# Patient Record
Sex: Female | Born: 1985 | Race: White | Hispanic: No | Marital: Married | Smoking: Former smoker
Health system: Southern US, Community
[De-identification: ages and names within clinical notes are randomized; demographics above are authoritative.]

## PROBLEM LIST (undated history)

## (undated) DIAGNOSIS — I456 Pre-excitation syndrome: Secondary | ICD-10-CM

## (undated) DIAGNOSIS — F419 Anxiety disorder, unspecified: Secondary | ICD-10-CM

## (undated) DIAGNOSIS — F429 Obsessive-compulsive disorder, unspecified: Secondary | ICD-10-CM

## (undated) HISTORY — DX: Obsessive-compulsive disorder, unspecified: F42.9

## (undated) HISTORY — PX: ATRIAL FIBRILLATION ABLATION: EP1191

## (undated) HISTORY — DX: Anxiety disorder, unspecified: F41.9

## (undated) HISTORY — PX: WISDOM TOOTH EXTRACTION: SHX21

## (undated) HISTORY — PX: OTHER SURGICAL HISTORY: SHX169

---

## 2010-12-24 ENCOUNTER — Emergency Department (INDEPENDENT_AMBULATORY_CARE_PROVIDER_SITE_OTHER): Payer: BC Managed Care – PPO

## 2010-12-24 ENCOUNTER — Emergency Department (HOSPITAL_BASED_OUTPATIENT_CLINIC_OR_DEPARTMENT_OTHER)
Admission: EM | Admit: 2010-12-24 | Discharge: 2010-12-24 | Disposition: A | Discharge status: Home / Self Care | Payer: BC Managed Care – PPO | Attending: Emergency Medicine | Admitting: Emergency Medicine

## 2010-12-24 DIAGNOSIS — R0789 Other chest pain: Secondary | ICD-10-CM

## 2010-12-24 DIAGNOSIS — R079 Chest pain, unspecified: Secondary | ICD-10-CM | POA: Insufficient documentation

## 2010-12-24 LAB — CBC
MCH: 30.8 pg (ref 26.0–34.0)
Platelets: 321 10*3/uL (ref 150–400)
RBC: 4.61 MIL/uL (ref 3.87–5.11)

## 2010-12-24 LAB — CK TOTAL AND CKMB (NOT AT ARMC): Total CK: 124 U/L (ref 7–177)

## 2010-12-24 LAB — TROPONIN I: Troponin I: <0.3 ng/mL (ref <0.30)

## 2010-12-24 LAB — BASIC METABOLIC PANEL
BUN: 8 mg/dL (ref 6–23)
CO2: 23 mEq/L (ref 19–32)
Chloride: 102 mEq/L (ref 96–112)
Creatinine, Ser: 0.8 mg/dL (ref 0.4–1.2)
Glucose, Bld: 102 mg/dL — ABNORMAL HIGH (ref 70–99)

## 2010-12-24 LAB — DIFFERENTIAL
Basophils Relative: 0 % (ref 0–1)
Eosinophils Absolute: 0.1 10*3/uL (ref 0.0–0.7)
Monocytes Relative: 11 % (ref 3–12)
Neutrophils Relative %: 73 % (ref 43–77)

## 2013-12-22 DIAGNOSIS — F429 Obsessive-compulsive disorder, unspecified: Secondary | ICD-10-CM

## 2013-12-22 DIAGNOSIS — I491 Atrial premature depolarization: Secondary | ICD-10-CM | POA: Insufficient documentation

## 2013-12-22 DIAGNOSIS — I456 Pre-excitation syndrome: Secondary | ICD-10-CM | POA: Insufficient documentation

## 2013-12-22 DIAGNOSIS — M419 Scoliosis, unspecified: Secondary | ICD-10-CM | POA: Insufficient documentation

## 2013-12-22 DIAGNOSIS — R002 Palpitations: Secondary | ICD-10-CM | POA: Insufficient documentation

## 2017-09-10 ENCOUNTER — Encounter (HOSPITAL_COMMUNITY): Payer: Self-pay | Admitting: *Deleted

## 2017-09-10 ENCOUNTER — Other Ambulatory Visit: Payer: Self-pay

## 2017-09-10 ENCOUNTER — Emergency Department (HOSPITAL_COMMUNITY)
Admission: EM | Admit: 2017-09-10 | Discharge: 2017-09-10 | Disposition: A | Discharge status: Home / Self Care | Payer: BLUE CROSS/BLUE SHIELD | Attending: Emergency Medicine | Admitting: Emergency Medicine

## 2017-09-10 DIAGNOSIS — R1030 Lower abdominal pain, unspecified: Secondary | ICD-10-CM | POA: Diagnosis present

## 2017-09-10 HISTORY — DX: Pre-excitation syndrome: I45.6

## 2017-09-10 LAB — CBC WITH DIFFERENTIAL/PLATELET
BASOS PCT: 0 %
Basophils Absolute: 0 10*3/uL (ref 0.0–0.1)
EOS PCT: 0 %
Eosinophils Absolute: 0 10*3/uL (ref 0.0–0.7)
HEMATOCRIT: 32.3 % — AB (ref 36.0–46.0)
HEMOGLOBIN: 10.7 g/dL — AB (ref 12.0–15.0)
Lymphocytes Relative: 19 %
Lymphs Abs: 2 10*3/uL (ref 0.7–4.0)
MCH: 27.6 pg (ref 26.0–34.0)
MCHC: 33.1 g/dL (ref 30.0–36.0)
MCV: 83.5 fL (ref 78.0–100.0)
MONO ABS: 1.2 10*3/uL (ref 0.1–1.0)
MONOS PCT: 12 %
NEUTROS ABS: 7 10*3/uL (ref 1.7–7.7)
Neutrophils Relative %: 69 %
Platelets: 304 10*3/uL (ref 150–400)
RBC: 3.87 MIL/uL (ref 3.87–5.11)
RDW: 13.1 % (ref 11.5–15.5)
WBC: 10.2 10*3/uL (ref 4.0–10.5)

## 2017-09-10 LAB — URINALYSIS, ROUTINE W REFLEX MICROSCOPIC
Bilirubin Urine: NEGATIVE
Glucose, UA: NEGATIVE mg/dL
Ketones, ur: NEGATIVE mg/dL
Leukocytes, UA: NEGATIVE
Nitrite: NEGATIVE
PROTEIN: NEGATIVE mg/dL
RBC / HPF: NONE SEEN RBC/hpf (ref 0–5)
Specific Gravity, Urine: 1.004 — ABNORMAL LOW (ref 1.005–1.030)
pH: 6 (ref 5.0–8.0)

## 2017-09-10 LAB — BASIC METABOLIC PANEL
Anion gap: 9 (ref 5–15)
BUN: 9 mg/dL (ref 6–20)
CHLORIDE: 108 mmol/L (ref 101–111)
CO2: 19 mmol/L — AB (ref 22–32)
Calcium: 8.8 mg/dL — ABNORMAL LOW (ref 8.9–10.3)
Creatinine, Ser: 0.7 mg/dL (ref 0.44–1.00)
GFR calc Af Amer: >60 mL/min (ref >60)
GFR calc non Af Amer: >60 mL/min (ref >60)
GLUCOSE: 96 mg/dL (ref 65–99)
POTASSIUM: 3.5 mmol/L (ref 3.5–5.1)
Sodium: 136 mmol/L (ref 135–145)

## 2017-09-10 MED ORDER — SODIUM CHLORIDE 0.9 % IV BOLUS (SEPSIS)
500.0000 mL | Freq: Once | INTRAVENOUS | Status: AC
  Administered 2017-09-10: 500 mL via INTRAVENOUS

## 2017-09-10 NOTE — ED Provider Notes (Signed)
[facility] EMERGENCY DEPARTMENT Provider Note   CSN: [id number] Arrival date & time: 09/10/17  1752     History   Chief Complaint Chief Complaint  Patient presents with  . Abdominal Pain    HPI Madeline Nelson is a 32 y.o. female.  Patient states that she is [redacted] weeks pregnant and felt like she had mucus discharge from her vagina and then a pressure on her lower abdomen.   The history is provided by the patient. No language interpreter was used.  Abdominal Pain   This is a new problem. The problem occurs rarely. The problem has not changed since onset.The pain is associated with an unknown factor. Pertinent negatives include diarrhea, frequency, hematuria and headaches.    Past Medical History:  Diagnosis Date  . Wolff-Parkinson-White (WPW) syndrome     Patient Active Problem List   Diagnosis Date Noted  . Palpitations 12/22/2013  . WPW (Wolff-Parkinson-White syndrome) 12/22/2013  . PAC (premature atrial contraction) 12/22/2013  . OCD (obsessive compulsive disorder) 12/22/2013  . Scoliosis 12/22/2013    Past Surgical History:  Procedure Laterality Date  . ATRIAL FIBRILLATION ABLATION    . CESAREAN SECTION    . WISDOM TOOTH EXTRACTION      OB History    Gravida Para Term Preterm AB Living   1             SAB TAB Ectopic Multiple Live Births                   Home Medications    Prior to Admission medications   Not on File    Family History History reviewed. No pertinent family history.  Social History Social History   Tobacco Use  . Smoking status: Never Smoker  . Smokeless tobacco: Never Used  Substance Use Topics  . Alcohol use: No    Frequency: Never  . Drug use: No     Allergies   Sudafed [pseudoephedrine hcl]   Review of Systems Review of Systems  Constitutional: Negative for appetite change and fatigue.  HENT: Negative for congestion, ear discharge and sinus pressure.   Eyes: Negative for discharge.  Respiratory: Negative for  cough.   Cardiovascular: Negative for chest pain.  Gastrointestinal: Positive for abdominal pain. Negative for diarrhea.  Genitourinary: Negative for frequency and hematuria.  Musculoskeletal: Negative for back pain.  Skin: Negative for rash.  Neurological: Negative for seizures and headaches.  Psychiatric/Behavioral: Negative for hallucinations.     Physical Exam Updated Vital Signs BP (!) 143/83 (BP Location: Right Arm)   Pulse (!) 115   Temp 98.3 F (36.8 C) (Oral)   Resp 18   Ht 5\' 4"  (1.626 m)   Wt 67.6 kg (149 lb)   SpO2 100%   BMI 25.58 kg/m   Physical Exam  Constitutional: She is oriented to person, place, and time. She appears well-developed.  HENT:  Head: Normocephalic.  Eyes: Conjunctivae and EOM are normal. No scleral icterus.  Neck: Neck supple. No thyromegaly present.  Cardiovascular: Normal rate and regular rhythm. Exam reveals no gallop and no friction rub.  No murmur heard. Pulmonary/Chest: No stridor. She has no wheezes. She has no rales. She exhibits no tenderness.  Abdominal: She exhibits no distension. There is no tenderness. There is no rebound.  Abdomen nontender but consistent with 3 4-week pregnancy  Genitourinary:  Genitourinary Comments: Bimanual exam done and patient is 0% effaced not dilated at all vertex presentation.  Nitrazine test was done and was consistent  with not ruptured membranes  Musculoskeletal: Normal range of motion. She exhibits no edema.  Lymphadenopathy:    She has no cervical adenopathy.  Neurological: She is oriented to person, place, and time. She exhibits normal muscle tone. Coordination normal.  Skin: No rash noted. No erythema.  Psychiatric: She has a normal mood and affect. Her behavior is normal.     ED Treatments / Results  Labs (all labs ordered are listed, but only abnormal results are displayed) Labs Reviewed  BASIC METABOLIC PANEL - Abnormal; Notable for the following components:      Result Value   CO2 19  (*)    Calcium 8.8 (*)    All other components within normal limits  URINALYSIS, ROUTINE W REFLEX MICROSCOPIC - Abnormal; Notable for the following components:   Color, Urine STRAW (*)    Specific Gravity, Urine 1.004 (*)    Hgb urine dipstick SMALL (*)    Bacteria, UA RARE (*)    Squamous Epithelial / LPF 0-5 (*)    All other components within normal limits  CBC WITH DIFFERENTIAL/PLATELET    EKG  EKG Interpretation None       Radiology No results found.  Procedures Procedures (including critical care time)  Medications Ordered in ED Medications  sodium chloride 0.9 % bolus 500 mL (500 mLs Intravenous New Bag/Given 09/10/17 1851)     Initial Impression / Assessment and Plan / ED Course  I have reviewed the triage vital signs and the nursing notes.  Pertinent labs & imaging results that were available during my care of the patient were reviewed by me and considered in my medical decision making (see chart for details).   Patient was monitored for over an hour and it was decided by the OB/GYN team at [facility] that the patient could be discharged home to be followed up with her OB doctor.  Diagnosis intrauterine pregnancy 34 weeks no preterm labor at this time or ruptured membranes  Final Clinical Impressions(s) / ED Diagnoses   Final diagnoses:  Lower abdominal pain    ED Discharge Orders    None       Bethann BerkshireZammit, Lexandra Rettke, MD 09/10/17 1956

## 2017-09-10 NOTE — Progress Notes (Signed)
Received call from [facility] RN, Verlon AuLeslie. Pt is a G3P2 at 4334 1/[redacted] weeks gestation presenting with c/o a thick white d/c and abd pressure. Pt has had two previous C/S. Says she gets [facility]. Says pt is not having any vaginal bleeding. Says Dr. Estell HarpinZammit. Has checked the pt's cervix and she is not dilated. He does not think her membranes are ruptured.

## 2017-09-10 NOTE — ED Notes (Signed)
[facility], may take off the monitor.

## 2017-09-10 NOTE — Progress Notes (Signed)
Reactive NST, FHT 145bpm, multilple accelerations, no decelerations, Cat 1 tracing. Notified Dr. Despina HiddenEure, OB cleared, advised Christell ConstantMoore RN ok to d/c monitoring.

## 2017-09-10 NOTE — ED Triage Notes (Signed)
Pt states she is [redacted] weeks pregnant and states about an hour ago she had a thick white discharge come out of her vagina; pt states she feels pressure to her pelvic area and is having some abdominal pain and nausea

## 2017-09-10 NOTE — Progress Notes (Signed)
Spoke with Dr. Despina HiddenEure. Pt is a G3P2 at 5434 1/[redacted] weeks gestation presenting to [facility] with c/o white vaginal d/c and abd pressure. Dr. Estell HarpinZammit says the pt's cervix is not dilated and she is not having vaginal bleeding. [facility]. Pt has had two previous C/S. FHR tracing is a category 1 with no uc's. Labs  and U/A have been ordered. We will cont to watch pt for any uc's, and if pt is not contracting, she can be OB cleared.. [facility].

## 2017-09-10 NOTE — ED Notes (Signed)
OB rapid response notified this nurse via telephone they will monitor patient for 30 more minutes, patient to follow up with womens/primary care dr.

## 2017-09-10 NOTE — Discharge Instructions (Signed)
Call your OB/GYN doctor tomorrow and let them know what happened.

## 2017-09-10 NOTE — ED Notes (Signed)
Litmus paper - 5

## 2017-09-10 NOTE — Progress Notes (Signed)
Report given to Kathy Larrabee RN 

## 2021-01-04 ENCOUNTER — Encounter: Payer: BC Managed Care – PPO | Admitting: Family Medicine

## 2021-01-08 ENCOUNTER — Other Ambulatory Visit: Payer: Self-pay

## 2021-01-08 ENCOUNTER — Ambulatory Visit (INDEPENDENT_AMBULATORY_CARE_PROVIDER_SITE_OTHER): Payer: BC Managed Care – PPO | Admitting: Women's Health

## 2021-01-08 ENCOUNTER — Encounter: Payer: Self-pay | Admitting: Women's Health

## 2021-01-08 VITALS — BP 131/88 | HR 97 | Ht 64.0 in | Wt 136.5 lb

## 2021-01-08 DIAGNOSIS — Z3201 Encounter for pregnancy test, result positive: Secondary | ICD-10-CM | POA: Diagnosis not present

## 2021-01-08 DIAGNOSIS — Z3491 Encounter for supervision of normal pregnancy, unspecified, first trimester: Secondary | ICD-10-CM | POA: Diagnosis not present

## 2021-01-08 DIAGNOSIS — Z3A01 Less than 8 weeks gestation of pregnancy: Secondary | ICD-10-CM | POA: Diagnosis not present

## 2021-01-08 DIAGNOSIS — Z98891 History of uterine scar from previous surgery: Secondary | ICD-10-CM | POA: Diagnosis not present

## 2021-01-08 DIAGNOSIS — F419 Anxiety disorder, unspecified: Secondary | ICD-10-CM

## 2021-01-08 LAB — POCT URINE PREGNANCY: Preg Test, Ur: POSITIVE — AB

## 2021-01-08 NOTE — Patient Instructions (Signed)
Baxter Hire, thank you for choosing our office today! We appreciate the opportunity to meet your healthcare needs. You may receive a short survey by mail, e-mail, or through [employer]. If you are happy with your care we would appreciate if you could take just a few minutes to complete the survey questions. We read all of your comments and take your feedback very seriously. Thank you again for choosing our office.  Center for [employer] Healthcare Team at [facility]  [facility] ([address] [place], [place] [address]) Entrance C, located off of [address] Free 24/7 valet parking   Nausea & Vomiting Have saltine crackers or pretzels by your bed and eat a few bites before you raise your head out of bed in the morning Eat small frequent meals throughout the day instead of large meals Drink plenty of fluids throughout the day to stay hydrated, just don't drink a lot of fluids with your meals.  This can make your stomach fill up faster making you feel sick Do not brush your teeth right after you eat Products with real ginger are good for nausea, like ginger ale and ginger hard candy Make sure it says made with real ginger! Sucking on sour candy like lemon heads is also good for nausea If your prenatal vitamins make you nauseated, take them at night so you will sleep through the nausea Sea Bands If you feel like you need medicine for the nausea & vomiting please let us know If you are unable to keep any fluids or food down please let us know   Constipation Drink plenty of fluid, preferably water, throughout the day Eat foods high in fiber such as fruits, vegetables, and grains Exercise, such as walking, is a good way to keep your bowels regular Drink warm fluids, especially warm prune juice, or decaf coffee Eat a 1/2 cup of real oatmeal (not instant), 1/2 cup applesauce, and 1/2-1 cup warm prune juice every day If needed, you may take Colace (docusate sodium) stool softener  once or twice a day to help keep the stool soft.  If you still are having problems with constipation, you may take Miralax once daily as needed to help keep your bowels regular.   Home Blood Pressure Monitoring for Patients   Your provider has recommended that you check your blood pressure (BP) at least once a week at home. If you do not have a blood pressure cuff at home, one will be provided for you. Contact your provider if you have not received your monitor within 1 week.   Helpful Tips for Accurate Home Blood Pressure Checks  Don't smoke, exercise, or drink caffeine 30 minutes before checking your BP Use the restroom before checking your BP (a full bladder can raise your pressure) Relax in a comfortable upright chair Feet on the ground Left arm resting comfortably on a flat surface at the level of your heart Legs uncrossed Back supported Sit quietly and don't talk Place the cuff on your bare arm Adjust snuggly, so that only two fingertips can fit between your skin and the top of the cuff Check 2 readings separated by at least one minute Keep a log of your BP readings For a visual, please reference this diagram: http://ccnc.care/bpdiagram  Provider Name: [facility]     Phone: [phone]  Zone 1: ALL CLEAR  Continue to monitor your symptoms:  BP reading is less than 140 (top number) or less than 90 (bottom  number)  No right upper stomach pain No headaches or seeing spots No feeling nauseated or throwing up No swelling in face and hands  Zone 2: CAUTION Call your doctor's office for any of the following:  BP reading is greater than 140 (top number) or greater than 90 (bottom number)  Stomach pain under your ribs in the middle or right side Headaches or seeing spots Feeling nauseated or throwing up Swelling in face and hands  Zone 3: EMERGENCY  Seek immediate medical care if you have any of the following:  BP reading is greater than160 (top number) or greater than  110 (bottom number) Severe headaches not improving with Tylenol Serious difficulty catching your breath Any worsening symptoms from Zone 2    First Trimester of Pregnancy The first trimester of pregnancy is from week 1 until the end of week 12 (months 1 through 3). A week after a sperm fertilizes an egg, the egg will implant on the wall of the uterus. This embryo will begin to develop into a baby. Genes from you and your partner are forming the baby. The female genes determine whether the baby is a boy or a girl. At 6-8 weeks, the eyes and face are formed, and the heartbeat can be seen on ultrasound. At the end of 12 weeks, all the baby's organs are formed.  Now that you are pregnant, you will want to do everything you can to have a healthy baby. Two of the most important things are to get good prenatal care and to follow your health care provider's instructions. Prenatal care is all the medical care you receive before the baby's birth. This care will help prevent, find, and treat any problems during the pregnancy and childbirth. BODY CHANGES Your body goes through many changes during pregnancy. The changes vary from woman to woman.  You may gain or lose a couple of pounds at first. You may feel sick to your stomach (nauseous) and throw up (vomit). If the vomiting is uncontrollable, call your health care provider. You may tire easily. You may develop headaches that can be relieved by medicines approved by your health care provider. You may urinate more often. Painful urination may mean you have a bladder infection. You may develop heartburn as a result of your pregnancy. You may develop constipation because certain hormones are causing the muscles that push waste through your intestines to slow down. You may develop hemorrhoids or swollen, bulging veins (varicose veins). Your breasts may begin to grow larger and become tender. Your nipples may stick out more, and the tissue that surrounds them  (areola) may become darker. Your gums may bleed and may be sensitive to brushing and flossing. Dark spots or blotches (chloasma, mask of pregnancy) may develop on your face. This will likely fade after the baby is born. Your menstrual periods will stop. You may have a loss of appetite. You may develop cravings for certain kinds of food. You may have changes in your emotions from day to day, such as being excited to be pregnant or being concerned that something may go wrong with the pregnancy and baby. You may have more vivid and strange dreams. You may have changes in your hair. These can include thickening of your hair, rapid growth, and changes in texture. Some women also have hair loss during or after pregnancy, or hair that feels dry or thin. Your hair will most likely return to normal after your baby is born. WHAT TO EXPECT AT YOUR PRENATAL  VISITS During a routine prenatal visit: You will be weighed to make sure you and the baby are growing normally. Your blood pressure will be taken. Your abdomen will be measured to track your baby's growth. The fetal heartbeat will be listened to starting around week 10 or 12 of your pregnancy. Test results from any previous visits will be discussed. Your health care provider may ask you: How you are feeling. If you are feeling the baby move. If you have had any abnormal symptoms, such as leaking fluid, bleeding, severe headaches, or abdominal cramping. If you have any questions. Other tests that may be performed during your first trimester include: Blood tests to find your blood type and to check for the presence of any previous infections. They will also be used to check for low iron levels (anemia) and Rh antibodies. Later in the pregnancy, blood tests for diabetes will be done along with other tests if problems develop. Urine tests to check for infections, diabetes, or protein in the urine. An ultrasound to confirm the proper growth and development  of the baby. An amniocentesis to check for possible genetic problems. Fetal screens for spina bifida and Down syndrome. You may need other tests to make sure you and the baby are doing well. HOME CARE INSTRUCTIONS  Medicines Follow your health care provider's instructions regarding medicine use. Specific medicines may be either safe or unsafe to take during pregnancy. Take your prenatal vitamins as directed. If you develop constipation, try taking a stool softener if your health care provider approves. Diet Eat regular, well-balanced meals. Choose a variety of foods, such as meat or vegetable-based protein, fish, milk and low-fat dairy products, vegetables, fruits, and whole grain breads and cereals. Your health care provider will help you determine the amount of weight gain that is right for you. Avoid raw meat and uncooked cheese. These carry germs that can cause birth defects in the baby. Eating four or five small meals rather than three large meals a day may help relieve nausea and vomiting. If you start to feel nauseous, eating a few soda crackers can be helpful. Drinking liquids between meals instead of during meals also seems to help nausea and vomiting. If you develop constipation, eat more high-fiber foods, such as fresh vegetables or fruit and whole grains. Drink enough fluids to keep your urine clear or pale yellow. Activity and Exercise Exercise only as directed by your health care provider. Exercising will help you: Control your weight. Stay in shape. Be prepared for labor and delivery. Experiencing pain or cramping in the lower abdomen or low back is a good sign that you should stop exercising. Check with your health care provider before continuing normal exercises. Try to avoid standing for long periods of time. Move your legs often if you must stand in one place for a long time. Avoid heavy lifting. Wear low-heeled shoes, and practice good posture. You may continue to have sex  unless your health care provider directs you otherwise. Relief of Pain or Discomfort Wear a good support bra for breast tenderness.   Take warm sitz baths to soothe any pain or discomfort caused by hemorrhoids. Use hemorrhoid cream if your health care provider approves.   Rest with your legs elevated if you have leg cramps or low back pain. If you develop varicose veins in your legs, wear support hose. Elevate your feet for 15 minutes, 3-4 times a day. Limit salt in your diet. Prenatal Care Schedule your prenatal visits by the  twelfth week of pregnancy. They are usually scheduled monthly at first, then more often in the last 2 months before delivery. Write down your questions. Take them to your prenatal visits. Keep all your prenatal visits as directed by your health care provider. Safety Wear your seat belt at all times when driving. Make a list of emergency phone numbers, including numbers for family, friends, the hospital, and police and fire departments. General Tips Ask your health care provider for a referral to a local prenatal education class. Begin classes no later than at the beginning of month 6 of your pregnancy. Ask for help if you have counseling or nutritional needs during pregnancy. Your health care provider can offer advice or refer you to specialists for help with various needs. Do not use hot tubs, steam rooms, or saunas. Do not douche or use tampons or scented sanitary pads. Do not cross your legs for long periods of time. Avoid cat litter boxes and soil used by cats. These carry germs that can cause birth defects in the baby and possibly loss of the fetus by miscarriage or stillbirth. Avoid all smoking, herbs, alcohol, and medicines not prescribed by your health care provider. Chemicals in these affect the formation and growth of the baby. Schedule a dentist appointment. At home, brush your teeth with a soft toothbrush and be gentle when you floss. SEEK MEDICAL CARE IF:   You have dizziness. You have mild pelvic cramps, pelvic pressure, or nagging pain in the abdominal area. You have persistent nausea, vomiting, or diarrhea. You have a bad smelling vaginal discharge. You have pain with urination. You notice increased swelling in your face, hands, legs, or ankles. SEEK IMMEDIATE MEDICAL CARE IF:  You have a fever. You are leaking fluid from your vagina. You have spotting or bleeding from your vagina. You have severe abdominal cramping or pain. You have rapid weight gain or loss. You vomit blood or material that looks like coffee grounds. You are exposed to [place] measles and have never had them. You are exposed to fifth disease or chickenpox. You develop a severe headache. You have shortness of breath. You have any kind of trauma, such as from a fall or a car accident. Document Released: 07/02/2001 Document Revised: 11/22/2013 Document Reviewed: 05/18/2013 [facility] Patient Information 2015 [place], [place]. This information is not intended to replace advice given to you by your health care provider. Make sure you discuss any questions you have with your health care provider.

## 2021-01-08 NOTE — Progress Notes (Signed)
   GYN VISIT Patient name: Madeline Nelson MRN [id number]  Date of birth: 11/01/1985 Chief Complaint:   Possible Pregnancy (Has had 3+ UPT @ home)  History of Present Illness:   Madeline Nelson is a 35 y.o. G57P3003 Caucasian female being seen today for +HPT x 3. LMP uncertain. No n/v. Taking pnv. Prev c/s x 3 in [place], moved to [place] w/in last few years.  Does have anxiety w/ panic attacks, has rx for lexapro and vistaril. Not currently taking, feels like she's doing well w/o it, but still likes to have on hand in case needed.  Patient's last menstrual period was 11/19/2020. The current method of family planning is none.  Depression screen [facility] 2/9 01/08/2021  Decreased Interest 0  Down, Depressed, Hopeless 0  PHQ - 2 Score 0  Altered sleeping 0  Tired, decreased energy 1  Change in appetite 0  Feeling bad or failure about yourself  0  Trouble concentrating 0  Moving slowly or fidgety/restless 0  Suicidal thoughts 0  PHQ-9 Score 1     GAD 7 : Generalized Anxiety Score 01/08/2021  Nervous, Anxious, on Edge 1  Control/stop worrying 0  Worry too much - different things 0  Trouble relaxing 0  Restless 0  Easily annoyed or irritable 0  Afraid - awful might happen 0  Total GAD 7 Score 1     Review of Systems:   Pertinent items are noted in HPI Denies fever/chills, dizziness, headaches, visual disturbances, fatigue, shortness of breath, chest pain, abdominal pain, vomiting, abnormal vaginal discharge/itching/odor/irritation, problems with periods, bowel movements, urination, or intercourse unless otherwise stated above.  Pertinent History Reviewed:  Reviewed past medical,surgical, social, obstetrical and family history.  Reviewed problem list, medications and allergies. Physical Assessment:   Vitals:   01/08/21 1032  Weight: 136 lb 8 oz (61.9 kg)  Height: 5\' 4"  (1.626 m)  Body mass index is 23.43 kg/m.       Physical Examination:   General appearance: alert, well appearing,  and in no distress  Mental status: alert, oriented to person, place, and time  Skin: warm & dry   Cardiovascular: normal heart rate noted  Respiratory: normal respiratory effort, no distress  Abdomen: soft, non-tender   Pelvic: examination not indicated  Extremities: no edema   Chaperone: N/A    Results for orders placed or performed in visit on 01/08/21 (from the past 24 hour(s))  POCT urine pregnancy   Collection Time: 01/08/21 10:45 AM  Result Value Ref Range   Preg Test, Ur Positive (A) Negative    Assessment & Plan:  1) ~[redacted]wks pregnant by uncertain LMP> will get dating u/s, continue pnv. New OB packet given.  2) Anxiety w/ panic attacks> feels she's doing well right now, has rx for lexapro and vistaril- not currently taking, likes to have in case needed.   Meds: No orders of the defined types were placed in this encounter.   Orders Placed This Encounter  Procedures   01/10/21 OB Comp Less 14 Wks   POCT urine pregnancy    Return for 1-2wks, [place]: dating.  [place] CNM, [facility] 01/08/2021 11:01 AM

## 2021-01-18 ENCOUNTER — Other Ambulatory Visit: Payer: Self-pay

## 2021-01-18 ENCOUNTER — Emergency Department (HOSPITAL_COMMUNITY)
Admission: EM | Admit: 2021-01-18 | Discharge: 2021-01-18 | Disposition: A | Discharge status: Home / Self Care | Payer: BC Managed Care – PPO | Attending: Emergency Medicine | Admitting: Emergency Medicine

## 2021-01-18 ENCOUNTER — Emergency Department (HOSPITAL_COMMUNITY): Payer: BC Managed Care – PPO

## 2021-01-18 ENCOUNTER — Encounter (HOSPITAL_COMMUNITY): Payer: Self-pay | Admitting: Emergency Medicine

## 2021-01-18 DIAGNOSIS — N898 Other specified noninflammatory disorders of vagina: Secondary | ICD-10-CM | POA: Diagnosis not present

## 2021-01-18 DIAGNOSIS — B9689 Other specified bacterial agents as the cause of diseases classified elsewhere: Secondary | ICD-10-CM | POA: Diagnosis not present

## 2021-01-18 DIAGNOSIS — Z3A01 Less than 8 weeks gestation of pregnancy: Secondary | ICD-10-CM | POA: Diagnosis not present

## 2021-01-18 DIAGNOSIS — N760 Acute vaginitis: Secondary | ICD-10-CM

## 2021-01-18 DIAGNOSIS — O23591 Infection of other part of genital tract in pregnancy, first trimester: Secondary | ICD-10-CM | POA: Insufficient documentation

## 2021-01-18 DIAGNOSIS — Z87891 Personal history of nicotine dependence: Secondary | ICD-10-CM | POA: Diagnosis not present

## 2021-01-18 DIAGNOSIS — O208 Other hemorrhage in early pregnancy: Secondary | ICD-10-CM | POA: Diagnosis not present

## 2021-01-18 DIAGNOSIS — O4690 Antepartum hemorrhage, unspecified, unspecified trimester: Secondary | ICD-10-CM

## 2021-01-18 LAB — URINALYSIS, ROUTINE W REFLEX MICROSCOPIC
Bacteria, UA: NONE SEEN
Bilirubin Urine: NEGATIVE
Glucose, UA: NEGATIVE mg/dL
Ketones, ur: NEGATIVE mg/dL
Leukocytes,Ua: NEGATIVE
Nitrite: NEGATIVE
Protein, ur: NEGATIVE mg/dL
Specific Gravity, Urine: 1.002 — ABNORMAL LOW (ref 1.005–1.030)
pH: 7 (ref 5.0–8.0)

## 2021-01-18 LAB — CBC WITH DIFFERENTIAL/PLATELET
Abs Immature Granulocytes: 0.03 10*3/uL (ref 0.00–0.07)
Basophils Absolute: 0.1 10*3/uL (ref 0.0–0.1)
Basophils Relative: 1 %
Eosinophils Absolute: 0 10*3/uL (ref 0.0–0.5)
Eosinophils Relative: 1 %
HCT: 47.9 % — ABNORMAL HIGH (ref 36.0–46.0)
Hemoglobin: 15.9 g/dL — ABNORMAL HIGH (ref 12.0–15.0)
Immature Granulocytes: 1 %
Lymphocytes Relative: 31 %
Lymphs Abs: 1.9 10*3/uL (ref 0.7–4.0)
MCH: 32.6 pg (ref 26.0–34.0)
MCHC: 33.2 g/dL (ref 30.0–36.0)
MCV: 98.2 fL (ref 80.0–100.0)
Monocytes Absolute: 0.6 10*3/uL (ref 0.1–1.0)
Monocytes Relative: 10 %
Neutro Abs: 3.5 10*3/uL (ref 1.7–7.7)
Neutrophils Relative %: 56 %
Platelets: 339 10*3/uL (ref 150–400)
RBC: 4.88 MIL/uL (ref 3.87–5.11)
RDW: 12.1 % (ref 11.5–15.5)
WBC: 6.1 10*3/uL (ref 4.0–10.5)
nRBC: 0 % (ref 0.0–0.2)

## 2021-01-18 LAB — COMPREHENSIVE METABOLIC PANEL
ALT: 15 U/L (ref 0–44)
AST: 18 U/L (ref 15–41)
Albumin: 5 g/dL (ref 3.5–5.0)
Alkaline Phosphatase: 54 U/L (ref 38–126)
Anion gap: 8 (ref 5–15)
BUN: 9 mg/dL (ref 6–20)
CO2: 23 mmol/L (ref 22–32)
Calcium: 10 mg/dL (ref 8.9–10.3)
Chloride: 105 mmol/L (ref 98–111)
Creatinine, Ser: 0.63 mg/dL (ref 0.44–1.00)
GFR, Estimated: >60 mL/min (ref >60)
Glucose, Bld: 101 mg/dL — ABNORMAL HIGH (ref 70–99)
Potassium: 3.8 mmol/L (ref 3.5–5.1)
Sodium: 136 mmol/L (ref 135–145)
Total Bilirubin: 0.7 mg/dL (ref 0.3–1.2)
Total Protein: 8.1 g/dL (ref 6.5–8.1)

## 2021-01-18 LAB — WET PREP, GENITAL
Sperm: NONE SEEN
Trich, Wet Prep: NONE SEEN
Yeast Wet Prep HPF POC: NONE SEEN

## 2021-01-18 LAB — HCG, QUANTITATIVE, PREGNANCY: hCG, Beta Chain, Quant, S: 10025 m[IU]/mL — ABNORMAL HIGH (ref <5)

## 2021-01-18 LAB — ABO/RH: ABO/RH(D): O POS

## 2021-01-18 MED ORDER — METRONIDAZOLE 500 MG PO TABS: 500.0000 mg | ORAL_TABLET | Freq: Two times a day (BID) | ORAL | 0 refills | Status: AC

## 2021-01-18 NOTE — Discharge Instructions (Addendum)
You were seen in the emergency department today with some vaginal bleeding in early pregnancy.  Your ultrasound shows the early sac that surrounds a pregnancy in the proper location but we were unable to see any fetus.  This may be because it is simply too early to see this on ultrasound.  Your OB/GYN will decide when a repeat ultrasound and/or lab work is required.  Please call the office today to arrange a follow up appointment.   You had some bacteria on your vaginal swabs and I am starting you on 1 week of antibiotics.  Please take these as directed.  Follow closely with your OB/GYN in the coming week.  Return with any new or sudden worsening symptoms.

## 2021-01-18 NOTE — ED Triage Notes (Signed)
Pt 8-[redacted] weeks pregnant and reports noticing small amount of vaginal bleeding and very mild lower abdominal discomfort that started this AM around 7:40. Instructed by OB to come to ED for eval

## 2021-01-18 NOTE — ED Notes (Signed)
Pt in bed, pt denies pain, denies bleeding, pt offers no complaints at this time.

## 2021-01-18 NOTE — ED Notes (Signed)
Assist DR with a wet prep

## 2021-01-18 NOTE — ED Provider Notes (Signed)
Emergency Department Provider Note   I have reviewed the triage vital signs and the nursing notes.   HISTORY  Chief Complaint Vaginal Bleeding (8-[redacted] weeks pregnant. Small amount of bleeding )   HPI Madeline Nelson is a 35 y.o. female [phone] with past medical history reviewed below presents to the emergency department with vaginal spotting this morning.  She is uncertain of her exact last menstrual period. She suspects somewhere around May 1st but notes that her cycles are often irregular.  She tested for pregnancy at home which came back positive and reestablished care with [facility].  She has been taking prenatal vitamins.  She has history of anxiety but is not currently taking prescription medication for this.  She developed some vaginal spotting this morning which she describes as small fingertip size amount of blood with occasional light cramping pain in the lower abdomen. Notes very mild cramping yesterday in the RLQ as well but no pain currently. Bleeding has not been heavy. No passage or clots or tissue. No CP or SOB.   Past Medical History:  Diagnosis Date   Anxiety    OCD (obsessive compulsive disorder)    Wolff-Parkinson-White (WPW) syndrome     Patient Active Problem List   Diagnosis Date Noted   Previous cesarean section 01/08/2021   Anxiety w/ panic attacks 01/08/2021   Palpitations 12/22/2013   WPW (Wolff-Parkinson-White syndrome) 12/22/2013   PAC (premature atrial contraction) 12/22/2013   OCD (obsessive compulsive disorder) 12/22/2013   Scoliosis 12/22/2013    Past Surgical History:  Procedure Laterality Date   ATRIAL FIBRILLATION ABLATION     CESAREAN SECTION     toe nail removal Bilateral    WISDOM TOOTH EXTRACTION      Allergies Sudafed [pseudoephedrine hcl]  Family History  Problem Relation Age of Onset   Cancer Paternal Grandfather    Congestive Heart Failure Father    Kidney failure Father    Other Daughter         microduplication 22Q11   Autism Daughter    Other Daughter        microduplication 22Q11    Social History Social History   Tobacco Use   Smoking status: Former    Pack years: 0.00    Types: Cigarettes   Smokeless tobacco: Never  Vaping Use   Vaping Use: Former  Substance Use Topics   Alcohol use: No   Drug use: No    Review of Systems  Constitutional: No fever/chills Eyes: No visual changes. ENT: No sore throat. Cardiovascular: Denies chest pain. Respiratory: Denies shortness of breath. Gastrointestinal: Mild cramping lower abdominal pain.  No nausea, no vomiting.  No diarrhea.  No constipation. Genitourinary: Negative for dysuria. Positive vaginal bleeding.  Musculoskeletal: Negative for back pain. Skin: Negative for rash. Neurological: Negative for headaches, focal weakness or numbness.  10-point ROS otherwise negative.  ____________________________________________   PHYSICAL EXAM:  VITAL SIGNS: ED Triage Vitals  Enc Vitals Group     BP 01/18/21 0828 (!) 149/90     Pulse Rate 01/18/21 0828 92     Resp 01/18/21 0828 18     Temp 01/18/21 0828 98.1 F (36.7 C)     Temp Source 01/18/21 0828 Oral     SpO2 01/18/21 0831 99 %     Weight 01/18/21 0833 136 lb (61.7 kg)     Height 01/18/21 0833 5\' 4"  (1.626 m)   Constitutional: Alert and oriented. Well appearing and in no acute distress. Eyes:  Conjunctivae are normal.  Head: Atraumatic. Nose: No congestion/rhinnorhea. Mouth/Throat: Mucous membranes are moist.  Neck: No stridor.  Cardiovascular: Normal rate, regular rhythm. Good peripheral circulation. Grossly normal heart sounds.   Respiratory: Normal respiratory effort.  No retractions. Lungs CTAB. Gastrointestinal: Soft and nontender. No distention.  Pelvic: Exam performed with patient's verbal consent and NT chaperone. Closed cervix. No visible blood. Scant discharge noted.  Musculoskeletal: No gross deformities of extremities. Neurologic:  Normal speech  and language.  Skin:  Skin is warm, dry and intact. No rash noted.   ____________________________________________   LABS (all labs ordered are listed, but only abnormal results are displayed)  Labs Reviewed  WET PREP, GENITAL - Abnormal; Notable for the following components:      Result Value   Clue Cells Wet Prep HPF POC PRESENT (*)    WBC, Wet Prep HPF POC RARE (*)    All other components within normal limits  COMPREHENSIVE METABOLIC PANEL - Abnormal; Notable for the following components:   Glucose, Bld 101 (*)    All other components within normal limits  CBC WITH DIFFERENTIAL/PLATELET - Abnormal; Notable for the following components:   Hemoglobin 15.9 (*)    HCT 47.9 (*)    All other components within normal limits  URINALYSIS, ROUTINE W REFLEX MICROSCOPIC - Abnormal; Notable for the following components:   Color, Urine STRAW (*)    Specific Gravity, Urine 1.002 (*)    Hgb urine dipstick MODERATE (*)    All other components within normal limits  HCG, QUANTITATIVE, PREGNANCY - Abnormal; Notable for the following components:   hCG, Beta Chain, Quant, S 10,025 (*)    All other components within normal limits  ABO/RH  GC/CHLAMYDIA PROBE AMP ([place]) NOT AT [facility]   ____________________________________________  RADIOLOGY  US OB Comp < 14 Wks  Result Date: 01/18/2021 CLINICAL DATA:  Pregnancy.  Bleeding. EXAM: OBSTETRIC <14 WK [place] AND TRANSVAGINAL OB US TECHNIQUE: Both transabdominal and transvaginal ultrasound examinations were performed for complete evaluation of the gestation as well as the maternal uterus, adnexal regions, and pelvic cul-de-sac. Transvaginal technique was performed to assess early pregnancy. COMPARISON:  No recent prior available. FINDINGS: Intrauterine gestational sac: Single Yolk sac:  Not visualized Embryo:  Not visualized Cardiac Activity: Not visualized MSD: 5.6 mm   5 w   2 d Subchorionic hemorrhage:  None visualized. Maternal uterus/adnexae:  Unremarkable.  No free pelvic fluid. IMPRESSION: Single tiny intrauterine gestational sac corresponding to 5 weeks 2 day pregnancy. Probable early intrauterine gestational sac, but no yolk sac, fetal pole, or cardiac activity yet visualized. Recommend follow-up quantitative B-HCG levels and follow-up US in 14 days to assess viability. This recommendation follows SRU consensus guidelines: Diagnostic Criteria for Nonviable Pregnancy Early in the First Trimester. Malva Limes Med 2013; 106:2694-85. Electronically Signed   By: Maisie Fus  Register   On: 01/18/2021 10:09   US OB Transvaginal  Result Date: 01/18/2021 CLINICAL DATA:  Pregnancy.  Bleeding. EXAM: TRANSVAGINAL OB ULTRASOUND TECHNIQUE: Transvaginal ultrasound was performed for complete evaluation of the gestation as well as the maternal uterus, adnexal regions, and pelvic cul-de-sac. COMPARISON:  None. FINDINGS: Please see abdominal Ob ultrasound which includes transvaginal findings. IMPRESSION: See above. Electronically Signed   By: Maisie Fus  Register   On: 01/18/2021 10:12    ____________________________________________   PROCEDURES  Procedure(s) performed:   Procedures  None  ____________________________________________   INITIAL IMPRESSION / ASSESSMENT AND PLAN / ED COURSE  Pertinent labs & imaging results that were available  during my care of the patient were reviewed by me and considered in my medical decision making (see chart for details).   Patient presents emergency department with very mild vaginal bleeding this morning along with some occasional cramping pain.  Abdomen is diffusely soft and nontender.  Vital signs are within normal limits.  My suspicion for ruptured ectopic pregnancy is very low however we have not confirmed IUP at this stage in pregnancy.  We will send for a OB ultrasound to confirm IUP.  Have sent a quant hCG, labs including ABO Rh as I was unable to find evidence of prior Rh status or blood type in available  records.   12:15 PM  Patient's quantitative hCG of 10,000 noted.  Her pelvic exam shows a closed cervix with no clot or active bleeding.  She has some discharge. Wet prep positive for Clue cells. No bacteruria. No UTI. Will treat with PO flagyl. US show a gestational sac in the uterus but no fetal pole. No other abnormalities. She will discuss with her OB regarding repeat hCG and [place]. RH positive so no rhogam. Plan for discharge.  ____________________________________________  FINAL CLINICAL IMPRESSION(S) / ED DIAGNOSES  Final diagnoses:  Vaginal bleeding in pregnancy  BV (bacterial vaginosis)    NEW OUTPATIENT MEDICATIONS STARTED DURING THIS VISIT:  New Prescriptions   METRONIDAZOLE (FLAGYL) 500 MG TABLET    Take 1 tablet (500 mg total) by mouth 2 (two) times daily for 7 days.    Note:  This document was prepared using Dragon voice recognition software and may include unintentional dictation errors.  Alona Bene, MD, [facility]     Claudell Wohler, Arlyss Repress, MD 01/18/21 [phone]

## 2021-01-18 NOTE — ED Notes (Signed)
@  11.40 set up pt for a wet prep exam

## 2021-01-18 NOTE — ED Notes (Signed)
Pt in ultrasound

## 2021-01-19 LAB — GC/CHLAMYDIA PROBE AMP (~~LOC~~) NOT AT ARMC
Chlamydia: NEGATIVE
Comment: NEGATIVE
Comment: NORMAL
Neisseria Gonorrhea: NEGATIVE

## 2021-01-23 ENCOUNTER — Other Ambulatory Visit: Payer: BC Managed Care – PPO

## 2021-02-13 ENCOUNTER — Other Ambulatory Visit: Payer: Self-pay | Admitting: Women's Health

## 2021-02-13 ENCOUNTER — Ambulatory Visit (INDEPENDENT_AMBULATORY_CARE_PROVIDER_SITE_OTHER): Payer: BC Managed Care – PPO

## 2021-02-13 ENCOUNTER — Other Ambulatory Visit: Payer: Self-pay

## 2021-02-13 ENCOUNTER — Encounter: Payer: Self-pay | Admitting: Adult Health

## 2021-02-13 ENCOUNTER — Ambulatory Visit (INDEPENDENT_AMBULATORY_CARE_PROVIDER_SITE_OTHER): Payer: BC Managed Care – PPO | Admitting: Adult Health

## 2021-02-13 VITALS — BP 132/95 | HR 88 | Ht 64.0 in | Wt 136.0 lb

## 2021-02-13 DIAGNOSIS — Z3201 Encounter for pregnancy test, result positive: Secondary | ICD-10-CM

## 2021-02-13 DIAGNOSIS — O039 Complete or unspecified spontaneous abortion without complication: Secondary | ICD-10-CM | POA: Insufficient documentation

## 2021-02-13 DIAGNOSIS — Z3491 Encounter for supervision of normal pregnancy, unspecified, first trimester: Secondary | ICD-10-CM

## 2021-02-13 DIAGNOSIS — Z3A01 Less than 8 weeks gestation of pregnancy: Secondary | ICD-10-CM | POA: Diagnosis not present

## 2021-02-13 NOTE — Progress Notes (Signed)
  Subjective:     Patient ID: Cheryle Horsfall, female   DOB: [birth year], 35 y.o.   MRN: [id number]  HPI Laiya is a 35 year old white female, married, [id number], in for [place], she has been bleeding for about 4 weeks now, and was seen in ER 01/18/21, US showed GS 5+2 weeks, no YS or fetal pole, QHCG was 10,025, blood type O+, GC/CHL negative.   Review of Systems +bleeding Reviewed past medical,surgical, social and family history. Reviewed medications and allergies.     Objective:   Physical Exam BP (!) 132/95 (BP Location: Left Arm, Patient Position: Sitting, Cuff Size: Normal)   Pulse 88   Ht 5\' 4"  (1.626 m)   Wt 136 lb (61.7 kg)   LMP 11/19/2020 (Approximate)   BMI 23.34 kg/m   Talk only: 01/19/2021 today showed GS 1.3 cm about 6 weeks and CRL 7.46, or about 6+4 weeks and no FHT.  Time with pt and husband 20 minutes. Assessment:     1. Miscarriage Discussed that this is miscarriage and and wait and let body continue the process or could take Cytotec, she will talk more with husband and let me know Check QHCG today      Plan:     Follow up in 2 weeks, can be virtual

## 2021-02-13 NOTE — Progress Notes (Signed)
US TV: 6+4 wks,single IUP,no FHT,CRL 7.46 mm,GS 12.7 mm,normal ovaries

## 2021-02-16 LAB — BETA HCG QUANT (REF LAB): hCG Quant: 3163 m[IU]/mL

## 2021-03-05 ENCOUNTER — Telehealth (INDEPENDENT_AMBULATORY_CARE_PROVIDER_SITE_OTHER): Payer: BC Managed Care – PPO | Admitting: Adult Health

## 2021-03-05 ENCOUNTER — Encounter: Payer: Self-pay | Admitting: Adult Health

## 2021-03-05 VITALS — Ht 64.0 in | Wt 143.0 lb

## 2021-03-05 DIAGNOSIS — O039 Complete or unspecified spontaneous abortion without complication: Secondary | ICD-10-CM | POA: Diagnosis not present

## 2021-03-05 NOTE — Progress Notes (Signed)
Patient ID: Madeline Nelson, female   DOB: 1985/11/20, 35 y.o.   MRN: [id number]   TELEHEALTH GYNECOLOGY VISIT ENCOUNTER NOTE  [facility]   Patient location: Home  I connected with Madeline Nelson on 03/05/21 at 10:50 AM EDT by telephone and verified that I am speaking with the correct person using two identifiers. Patient was unable to do MyChart audiovisual encounter due to technical difficulties, she tried several times.    I discussed the limitations, risks, security and privacy concerns of performing an evaluation and management service by telephone and the availability of in person appointments. I also discussed with the patient that there may be a patient responsible charge related to this service. The patient expressed understanding and agreed to proceed.   History:  Madeline Nelson is a 35 y.o. [phone] female being evaluated today for follow up on miscarriage and she has bleed and passed tissue. She denies any abnormal vaginal discharge, bleeding, pelvic pain or other concerns.       Past Medical History:  Diagnosis Date   Anxiety    OCD (obsessive compulsive disorder)    Wolff-Parkinson-White (WPW) syndrome    Past Surgical History:  Procedure Laterality Date   ATRIAL FIBRILLATION ABLATION     CESAREAN SECTION     toe nail removal Bilateral    WISDOM TOOTH EXTRACTION     The following portions of the patient's history were reviewed and updated as appropriate: allergies, current medications, past family history, past medical history, past social history, past surgical history and problem list.   Health Maintenance: no pap on file.  Review of Systems:  Pertinent items noted in HPI and remainder of comprehensive ROS otherwise negative.  Physical Exam:   General:  Alert, oriented and cooperative.   Mental Status: Normal mood and affect perceived. Normal judgment and thought content.  Physical exam deferred due to nature of  the encounter Ht 5\' 4"  (1.626 m)   Wt 143 lb (64.9 kg)   BMI 24.55 kg/m   Labs and Imaging [facility] was 3163 on 02/15/21 Blood type is O+ No results found for this or any previous visit (from the past 336 hour(s)). 02/17/21 OB Transvaginal  Result Date: 02/14/2021 Formatting of this result is different from the original. Images from the original result were not included.  Center for [facility] @ [facility] [address] [place]                                                                                              DATING AND VIABILITY SONOGRAM Madeline Nelson is a 35 y.o. year old G13P3003 with unknown LMP.G6P4  She has regular menstrual cycles.   She is here today for vaginal bleeding. GESTATION: SINGLETON   FETAL ACTIVITY:          Heart rate         No fht        CERVIX: Appears closed ADNEXA: The ovaries are normal. GESTATIONAL AGE AND  BIOMETRICS: Gestational criteria: Estimated Date of Delivery: unknown LMP Previous Scans:0 GESTATIONAL SAC  12.7 mm         6 weeks CROWN RUMP LENGTH           7.46 mm         6+4 weeks                                                                      AVERAGE EGA(BY THIS SCAN):  6+4 weeks TECHNICIAN COMMENTS: US TV: 6+4 wks,single IUP,no FHT,CRL 7.46 mm,GS 12.7 mm,normal ovaries,Madeline Nelson discussed results with patient A copy of this report including all images has been saved and backed up to a second source for retrieval if needed. All measures and details of the anatomical scan, placentation, fluid volume and pelvic anatomy are contained in that report. Madeline Nelson 02/13/2021 1:04 PM Clinical Impression and recommendations: I have reviewed the sonogram results above, combined with the patient's current clinical course, below are my impressions and any appropriate recommendations for management based on the sonographic findings. Inevitable miscarriage of a non viable pregnancy G4P3003 Estimated Date of Delivery: 08/26/21 Normal general  sonographic findings CRL > 7 mm without FCA and comparison study 01/18/21 reveals no interval pregnancy growth or development This recommendation follows SRU consensus guidelines: Diagnostic Criteria for Nonviable Pregnancy Early in the First Trimester. Madeline Nelson Med 2013; 161:0960-45. Madeline Nelson 02/14/2021 1:40 PM     Assessment and Plan:     1. Miscarriage Still talking with husband about birth control Will check QHCG this week,  - Beta hCG quant (ref lab)       I discussed the assessment and treatment plan with the patient. The patient was provided an opportunity to ask questions and all were answered. The patient agreed with the plan and demonstrated an understanding of the instructions.   The patient was advised to call back or seek an in-person evaluation/go to the ED if the symptoms worsen or if the condition fails to improve as anticipated.  I provided  5 minutes of non-face-to-face time during this encounter. I was in my office at [facility] during this encounter   Madeline Mourning, NP Center for [employer], [facility]

## 2021-03-06 ENCOUNTER — Other Ambulatory Visit: Payer: Self-pay | Admitting: Adult Health

## 2021-03-06 DIAGNOSIS — O039 Complete or unspecified spontaneous abortion without complication: Secondary | ICD-10-CM

## 2021-03-06 LAB — BETA HCG QUANT (REF LAB): hCG Quant: 414 m[IU]/mL

## 2021-03-06 NOTE — Progress Notes (Signed)
Recheck QHCG in 2 weeks

## 2022-01-17 ENCOUNTER — Ambulatory Visit (INDEPENDENT_AMBULATORY_CARE_PROVIDER_SITE_OTHER): Payer: BC Managed Care – PPO | Admitting: *Deleted

## 2022-01-17 VITALS — BP 134/90 | HR 97

## 2022-01-17 DIAGNOSIS — Z3201 Encounter for pregnancy test, result positive: Secondary | ICD-10-CM | POA: Diagnosis not present

## 2022-01-17 DIAGNOSIS — N926 Irregular menstruation, unspecified: Secondary | ICD-10-CM | POA: Diagnosis not present

## 2022-01-17 LAB — POCT URINE PREGNANCY: Preg Test, Ur: POSITIVE — AB

## 2022-01-17 NOTE — Progress Notes (Signed)
   NURSE VISIT- PREGNANCY CONFIRMATION   SUBJECTIVE:  Madeline Nelson is a 36 y.o. [phone] female at [redacted]w[redacted]d by no complaints of Patient's last menstrual period was 12/22/2021. Here for pregnancy confirmation.  Home pregnancy test: positive x 2   She reports no complaints.  She is taking prenatal vitamins.    OBJECTIVE:  BP 134/90 (BP Location: Right Arm, Patient Position: Sitting, Cuff Size: Normal)   Pulse 97   LMP 12/22/2021   Appears well, in no apparent distress  Results for orders placed or performed in visit on 01/17/22 (from the past 24 hour(s))  POCT urine pregnancy   Collection Time: 01/17/22  3:25 PM  Result Value Ref Range   Preg Test, Ur Positive (A) Negative    ASSESSMENT: Positive pregnancy test, [redacted]w[redacted]d by LMP    PLAN: Schedule for dating ultrasound in 4 weeks Prenatal vitamins: continue   Nausea medicines: not currently needed   OB packet given: No Return next week for bp check  Jobe Marker  01/17/2022 3:25 PM

## 2022-01-24 ENCOUNTER — Ambulatory Visit (INDEPENDENT_AMBULATORY_CARE_PROVIDER_SITE_OTHER): Payer: BC Managed Care – PPO | Admitting: *Deleted

## 2022-01-24 VITALS — BP 136/88 | HR 80

## 2022-01-24 DIAGNOSIS — Z0130 Encounter for examination of blood pressure without abnormal findings: Secondary | ICD-10-CM

## 2022-01-24 NOTE — Progress Notes (Signed)
   NURSE VISIT- BLOOD PRESSURE CHECK  SUBJECTIVE:  Madeline Nelson is a 36 y.o. [phone] female here for BP check. She is [redacted]w[redacted]d pregnant    HYPERTENSION ROS:  Pregnant:  Severe headaches that don't go away with tylenol/other medicines: No  Visual changes (seeing spots/double/blurred vision) No  Severe pain under right breast breast or in center of upper chest No  Severe nausea/vomiting No  Taking medicines as instructed not applicable  OBJECTIVE:  LMP 12/22/2021   Appearance alert, well appearing, and in no distress and oriented to person, place, and time.  ASSESSMENT: Pregnancy [redacted]w[redacted]d  blood pressure check  PLAN: Discussed with Cyril Mourning, AGNP   Recommendations: no changes needed   Follow-up: as scheduled   Jobe Marker  01/24/2022 10:32 AM

## 2022-02-13 ENCOUNTER — Other Ambulatory Visit: Payer: Self-pay | Admitting: Obstetrics & Gynecology

## 2022-02-13 DIAGNOSIS — O3680X0 Pregnancy with inconclusive fetal viability, not applicable or unspecified: Secondary | ICD-10-CM

## 2022-02-14 ENCOUNTER — Ambulatory Visit (INDEPENDENT_AMBULATORY_CARE_PROVIDER_SITE_OTHER): Payer: BC Managed Care – PPO

## 2022-02-14 DIAGNOSIS — Z3A01 Less than 8 weeks gestation of pregnancy: Secondary | ICD-10-CM

## 2022-02-14 DIAGNOSIS — O3680X0 Pregnancy with inconclusive fetal viability, not applicable or unspecified: Secondary | ICD-10-CM

## 2022-02-14 NOTE — Progress Notes (Signed)
[place] 7+5 wks,single IUP with yolk sac,subchorionic hemorrhage 1.6 x 1.2 x 1.9 cm,FHR 173 bpm,CRL 17.8 mm

## 2022-02-19 ENCOUNTER — Encounter: Payer: Self-pay | Admitting: Advanced Practice Midwife

## 2022-02-19 ENCOUNTER — Other Ambulatory Visit: Payer: Self-pay | Admitting: Adult Health

## 2022-02-19 MED ORDER — PROMETHAZINE HCL 25 MG PO TABS: 25.0000 mg | ORAL_TABLET | Freq: Four times a day (QID) | ORAL | 1 refills | Status: DC | PRN

## 2022-02-19 NOTE — Progress Notes (Signed)
Will rx phenergan for N/V

## 2022-03-14 ENCOUNTER — Ambulatory Visit (INDEPENDENT_AMBULATORY_CARE_PROVIDER_SITE_OTHER): Payer: BC Managed Care – PPO | Admitting: Advanced Practice Midwife

## 2022-03-14 ENCOUNTER — Other Ambulatory Visit (HOSPITAL_COMMUNITY)
Admission: RE | Admit: 2022-03-14 | Discharge: 2022-03-14 | Disposition: A | Discharge status: Home / Self Care | Payer: BC Managed Care – PPO | Source: Ambulatory Visit | Attending: Advanced Practice Midwife | Admitting: Advanced Practice Midwife

## 2022-03-14 ENCOUNTER — Encounter: Payer: Self-pay | Admitting: Advanced Practice Midwife

## 2022-03-14 ENCOUNTER — Ambulatory Visit: Payer: BC Managed Care – PPO | Admitting: *Deleted

## 2022-03-14 VITALS — BP 130/88 | HR 78 | Wt 123.0 lb

## 2022-03-14 DIAGNOSIS — Z3A11 11 weeks gestation of pregnancy: Secondary | ICD-10-CM | POA: Insufficient documentation

## 2022-03-14 DIAGNOSIS — Z3481 Encounter for supervision of other normal pregnancy, first trimester: Secondary | ICD-10-CM | POA: Diagnosis not present

## 2022-03-14 DIAGNOSIS — Z3480 Encounter for supervision of other normal pregnancy, unspecified trimester: Secondary | ICD-10-CM | POA: Insufficient documentation

## 2022-03-14 DIAGNOSIS — Z3490 Encounter for supervision of normal pregnancy, unspecified, unspecified trimester: Secondary | ICD-10-CM | POA: Insufficient documentation

## 2022-03-14 DIAGNOSIS — I456 Pre-excitation syndrome: Secondary | ICD-10-CM

## 2022-03-14 LAB — POCT URINALYSIS DIPSTICK OB
Blood, UA: NEGATIVE
Glucose, UA: NEGATIVE
Ketones, UA: NEGATIVE
Leukocytes, UA: NEGATIVE
Nitrite, UA: NEGATIVE
POC,PROTEIN,UA: NEGATIVE

## 2022-03-14 MED ORDER — ONDANSETRON 4 MG PO TBDP: 4.0000 mg | ORAL_TABLET | Freq: Four times a day (QID) | ORAL | 2 refills | Status: DC | PRN

## 2022-03-14 NOTE — Progress Notes (Signed)
INITIAL OBSTETRICAL VISIT Patient name: Madeline Nelson MRN [id number]  Date of birth: May 20, 1986 Chief Complaint:   Initial Prenatal Visit  History of Present Illness:   Madeline Nelson is a 36 y.o. [id number]  female at [redacted]w[redacted]d by LMP c/w u/s at 8 weeks with an Estimated Date of Delivery: 09/28/22 being seen today for her initial obstetrical visit.   Her obstetrical history is significant for 3 term CS (1st one for NRFHT, others were repeat), early SAB last year.  AMA  .  Monitors BP at home, was 112/72.  Had ablation for WPS,was told she was released from cardiology care and hasn't had any problems  since.   Today she reports nausea.     03/14/2022    9:38 AM 01/08/2021   10:47 AM  Depression screen PHQ 2/9  Decreased Interest 1 0  Down, Depressed, Hopeless 1 0  PHQ - 2 Score 2 0  Altered sleeping 0 0  Tired, decreased energy 1 1  Change in appetite 0 0  Feeling bad or failure about yourself  0 0  Trouble concentrating 0 0  Moving slowly or fidgety/restless 0 0  Suicidal thoughts 0 0  PHQ-9 Score 3 1    Patient's last menstrual period was 12/22/2021. Last pap 4 years ago. Results were:  normal per pt Review of Systems:   Pertinent items are noted in HPI Denies cramping/contractions, leakage of fluid, vaginal bleeding, abnormal vaginal discharge w/ itching/odor/irritation, headaches, visual changes, shortness of breath, chest pain, abdominal pain, severe nausea/vomiting, or problems with urination or bowel movements unless otherwise stated above.  Pertinent History Reviewed:  Reviewed past medical,surgical, social, obstetrical and family history.  Reviewed problem list, medications and allergies. OB History  Gravida Para Term Preterm AB Living  5 3 3   1 3   SAB IAB Ectopic Multiple Live Births  1       3    # Outcome Date GA Lbr Len/2nd Weight Sex Delivery Anes PTL Lv  5 Current           4 SAB 2022          3 Term 10/14/17 [redacted]w[redacted]d  7 lb 2 oz (3.232 kg) M CS-LTranv Spinal N LIV  2  Term 01/12/16 [redacted]w[redacted]d  5 lb 13 oz (2.637 kg) F CS-LTranv Spinal N LIV  1 Term 03/19/12 [redacted]w[redacted]d  6 lb 7 oz (2.92 kg) F CS-LTranv EPI, Gen N LIV     Complications: Fetal Intolerance    Obstetric Comments  Scant amount of vaginal bleeding noted this date, hx of same with prior pregnancy   Physical Assessment:   Vitals:   03/14/22 0935 03/14/22 0956  BP: 132/89 130/88  Pulse: 78   Weight: 123 lb (55.8 kg)   Body mass index is 21.11 kg/m.       Physical Examination:  General appearance - well appearing, and in no distress  Mental status - alert, oriented to person, place, and time  Psych:  She has a normal mood and affect  Skin - warm and dry, normal color, no suspicious lesions noted  Chest - effort normal  Heart - normal rate and regular rhythm  Abdomen - soft, nontender  Extremities:  No swelling or varicosities noted  Pelvic - VULVA: normal appearing vulva with no masses, tenderness or lesions  VAGINA: normal appearing vagina with normal color and discharge, no lesions  CERVIX: normal appearing cervix without discharge or lesions, no CMT  Thin prep pap is done with  HR HPV cotesting    Results for orders placed or performed in visit on 03/14/22 (from the past 24 hour(s))  POC Urinalysis Dipstick OB   Collection Time: 03/14/22 10:00 AM  Result Value Ref Range   Color, UA     Clarity, UA     Glucose, UA Negative Negative   Bilirubin, UA     Ketones, UA neg    Spec Grav, UA     Blood, UA neg    pH, UA     POC,PROTEIN,UA Negative Negative, Trace, Small (1+), Moderate (2+), Large (3+), 4+   Urobilinogen, UA     Nitrite, UA neg    Leukocytes, UA Negative Negative   Appearance     Odor      Assessment & Plan:  1) Low-Risk Pregnancy [id number] at [redacted]w[redacted]d with an Estimated Date of Delivery: 09/28/22   2) Initial OB visit  3) Hx CS X3, for repeat  4) AMA 35:  no other risk factors for PreE, defer ASA  Meds:  Meds ordered this encounter  Medications   ondansetron (ZOFRAN-ODT) 4  MG disintegrating tablet    Sig: Take 1 tablet (4 mg total) by mouth every 6 (six) hours as needed for nausea.    Dispense:  30 tablet    Refill:  2    Order Specific Question:   Supervising Provider    Answer:   Duane Lope H [[id number]]    Initial labs obtained Continue prenatal vitamins Reviewed n/v relief measures and warning s/s to report Reviewed recommended weight gain based on pre-gravid BMI Encouraged well-balanced diet Genetic & carrier screening discussed: declines Panorama, NT/IT, AFP, and Horizon ,   Ultrasound discussed; fetal survey: requested CCNC completed> form faxed if has or is planning to apply for medicaid The nature of [employer] for [employer] with multiple MDs and other Advanced Practice Providers was explained to patient; also emphasized that fellows, residents, and students are part of our team.  Has home bp cuff. Check bp weekly, let us know if >140/90.        Madeline Nelson 10:41 AM

## 2022-03-14 NOTE — Patient Instructions (Signed)
[employer], I greatly value your feedback.  If you receive a survey following your visit with [place] today, we appreciate you taking the time to fill it out.  Thanks, Cathie Beams, DNP, CNM  [facility] HAS MOVED!!! It is now [facility] ([address] [place], [place] [address]) Entrance located off of [address] Free 24/7 valet parking   Nausea & Vomiting Have saltine crackers or pretzels by your bed and eat a few bites before you raise your head out of bed in the morning Eat small frequent meals throughout the day instead of large meals Drink plenty of fluids throughout the day to stay hydrated, just don't drink a lot of fluids with your meals.  This can make your stomach fill up faster making you feel sick Do not brush your teeth right after you eat Products with real ginger are good for nausea, like ginger ale and ginger hard candy Make sure it says made with real ginger! Sucking on sour candy like lemon heads is also good for nausea If your prenatal vitamins make you nauseated, take them at night so you will sleep through the nausea Sea Bands If you feel like you need medicine for the nausea & vomiting please let us know If you are unable to keep any fluids or food down please let us know   Constipation Drink plenty of fluid, preferably water, throughout the day Eat foods high in fiber such as fruits, vegetables, and grains Exercise, such as walking, is a good way to keep your bowels regular Drink warm fluids, especially warm prune juice, or decaf coffee Eat a 1/2 cup of real oatmeal (not instant), 1/2 cup applesauce, and 1/2-1 cup warm prune juice every day If needed, you may take Colace (docusate sodium) stool softener once or twice a day to help keep the stool soft.  If you still are having problems with constipation, you may take Miralax once daily as needed to help keep your bowels regular.   Home Blood Pressure Monitoring for  Patients   Your provider has recommended that you check your blood pressure (BP) at least once a week at home. If you do not have a blood pressure cuff at home, one will be provided for you. Contact your provider if you have not received your monitor within 1 week.   Helpful Tips for Accurate Home Blood Pressure Checks  Don't smoke, exercise, or drink caffeine 30 minutes before checking your BP Use the restroom before checking your BP (a full bladder can raise your pressure) Relax in a comfortable upright chair Feet on the ground Left arm resting comfortably on a flat surface at the level of your heart Legs uncrossed Back supported Sit quietly and don't talk Place the cuff on your bare arm Adjust snuggly, so that only two fingertips can fit between your skin and the top of the cuff Check 2 readings separated by at least one minute Keep a log of your BP readings For a visual, please reference this diagram: http://ccnc.care/bpdiagram  Provider Name: [facility]     Phone: [phone]  Zone 1: ALL CLEAR  Continue to monitor your symptoms:  BP reading is less than 140 (top number) or less than 90 (bottom number)  No right upper stomach pain No headaches or seeing spots No feeling nauseated or throwing up No swelling in face and hands  Zone 2: CAUTION Call your doctor's office for any of the following:  BP  reading is greater than 140 (top number) or greater than 90 (bottom number)  Stomach pain under your ribs in the middle or right side Headaches or seeing spots Feeling nauseated or throwing up Swelling in face and hands  Zone 3: EMERGENCY  Seek immediate medical care if you have any of the following:  BP reading is greater than160 (top number) or greater than 110 (bottom number) Severe headaches not improving with Tylenol Serious difficulty catching your breath Any worsening symptoms from Zone 2    First Trimester of Pregnancy The first trimester of pregnancy is from  week 1 until the end of week 12 (months 1 through 3). A week after a sperm fertilizes an egg, the egg will implant on the wall of the uterus. This embryo will begin to develop into a baby. Genes from you and your partner are forming the baby. The female genes determine whether the baby is a boy or a girl. At 6-8 weeks, the eyes and face are formed, and the heartbeat can be seen on ultrasound. At the end of 12 weeks, all the baby's organs are formed.  Now that you are pregnant, you will want to do everything you can to have a healthy baby. Two of the most important things are to get good prenatal care and to follow your health care provider's instructions. Prenatal care is all the medical care you receive before the baby's birth. This care will help prevent, find, and treat any problems during the pregnancy and childbirth. BODY CHANGES Your body goes through many changes during pregnancy. The changes vary from woman to woman.  You may gain or lose a couple of pounds at first. You may feel sick to your stomach (nauseous) and throw up (vomit). If the vomiting is uncontrollable, call your health care provider. You may tire easily. You may develop headaches that can be relieved by medicines approved by your health care provider. You may urinate more often. Painful urination may mean you have a bladder infection. You may develop heartburn as a result of your pregnancy. You may develop constipation because certain hormones are causing the muscles that push waste through your intestines to slow down. You may develop hemorrhoids or swollen, bulging veins (varicose veins). Your breasts may begin to grow larger and become tender. Your nipples may stick out more, and the tissue that surrounds them (areola) may become darker. Your gums may bleed and may be sensitive to brushing and flossing. Dark spots or blotches (chloasma, mask of pregnancy) may develop on your face. This will likely fade after the baby is  born. Your menstrual periods will stop. You may have a loss of appetite. You may develop cravings for certain kinds of food. You may have changes in your emotions from day to day, such as being excited to be pregnant or being concerned that something may go wrong with the pregnancy and baby. You may have more vivid and strange dreams. You may have changes in your hair. These can include thickening of your hair, rapid growth, and changes in texture. Some women also have hair loss during or after pregnancy, or hair that feels dry or thin. Your hair will most likely return to normal after your baby is born. WHAT TO EXPECT AT YOUR PRENATAL VISITS During a routine prenatal visit: You will be weighed to make sure you and the baby are growing normally. Your blood pressure will be taken. Your abdomen will be measured to track your baby's growth. The fetal heartbeat  will be listened to starting around week 10 or 12 of your pregnancy. Test results from any previous visits will be discussed. Your health care provider may ask you: How you are feeling. If you are feeling the baby move. If you have had any abnormal symptoms, such as leaking fluid, bleeding, severe headaches, or abdominal cramping. If you have any questions. Other tests that may be performed during your first trimester include: Blood tests to find your blood type and to check for the presence of any previous infections. They will also be used to check for low iron levels (anemia) and Rh antibodies. Later in the pregnancy, blood tests for diabetes will be done along with other tests if problems develop. Urine tests to check for infections, diabetes, or protein in the urine. An ultrasound to confirm the proper growth and development of the baby. An amniocentesis to check for possible genetic problems. Fetal screens for spina bifida and Down syndrome. You may need other tests to make sure you and the baby are doing well. HOME CARE  INSTRUCTIONS  Medicines Follow your health care provider's instructions regarding medicine use. Specific medicines may be either safe or unsafe to take during pregnancy. Take your prenatal vitamins as directed. If you develop constipation, try taking a stool softener if your health care provider approves. Diet Eat regular, well-balanced meals. Choose a variety of foods, such as meat or vegetable-based protein, fish, milk and low-fat dairy products, vegetables, fruits, and whole grain breads and cereals. Your health care provider will help you determine the amount of weight gain that is right for you. Avoid raw meat and uncooked cheese. These carry germs that can cause birth defects in the baby. Eating four or five small meals rather than three large meals a day may help relieve nausea and vomiting. If you start to feel nauseous, eating a few soda crackers can be helpful. Drinking liquids between meals instead of during meals also seems to help nausea and vomiting. If you develop constipation, eat more high-fiber foods, such as fresh vegetables or fruit and whole grains. Drink enough fluids to keep your urine clear or pale yellow. Activity and Exercise Exercise only as directed by your health care provider. Exercising will help you: Control your weight. Stay in shape. Be prepared for labor and delivery. Experiencing pain or cramping in the lower abdomen or low back is a good sign that you should stop exercising. Check with your health care provider before continuing normal exercises. Try to avoid standing for long periods of time. Move your legs often if you must stand in one place for a long time. Avoid heavy lifting. Wear low-heeled shoes, and practice good posture. You may continue to have sex unless your health care provider directs you otherwise. Relief of Pain or Discomfort Wear a good support bra for breast tenderness.   Take warm sitz baths to soothe any pain or discomfort caused by  hemorrhoids. Use hemorrhoid cream if your health care provider approves.   Rest with your legs elevated if you have leg cramps or low back pain. If you develop varicose veins in your legs, wear support hose. Elevate your feet for 15 minutes, 3-4 times a day. Limit salt in your diet. Prenatal Care Schedule your prenatal visits by the twelfth week of pregnancy. They are usually scheduled monthly at first, then more often in the last 2 months before delivery. Write down your questions. Take them to your prenatal visits. Keep all your prenatal visits as directed by  your health care provider. Safety Wear your seat belt at all times when driving. Make a list of emergency phone numbers, including numbers for family, friends, the hospital, and police and fire departments. General Tips Ask your health care provider for a referral to a local prenatal education class. Begin classes no later than at the beginning of month 6 of your pregnancy. Ask for help if you have counseling or nutritional needs during pregnancy. Your health care provider can offer advice or refer you to specialists for help with various needs. Do not use hot tubs, steam rooms, or saunas. Do not douche or use tampons or scented sanitary pads. Do not cross your legs for long periods of time. Avoid cat litter boxes and soil used by cats. These carry germs that can cause birth defects in the baby and possibly loss of the fetus by miscarriage or stillbirth. Avoid all smoking, herbs, alcohol, and medicines not prescribed by your health care provider. Chemicals in these affect the formation and growth of the baby. Schedule a dentist appointment. At home, brush your teeth with a soft toothbrush and be gentle when you floss. SEEK MEDICAL CARE IF:  You have dizziness. You have mild pelvic cramps, pelvic pressure, or nagging pain in the abdominal area. You have persistent nausea, vomiting, or diarrhea. You have a bad smelling vaginal  discharge. You have pain with urination. You notice increased swelling in your face, hands, legs, or ankles. SEEK IMMEDIATE MEDICAL CARE IF:  You have a fever. You are leaking fluid from your vagina. You have spotting or bleeding from your vagina. You have severe abdominal cramping or pain. You have rapid weight gain or loss. You vomit blood or material that looks like coffee grounds. You are exposed to [place] measles and have never had them. You are exposed to fifth disease or chickenpox. You develop a severe headache. You have shortness of breath. You have any kind of trauma, such as from a fall or a car accident. Document Released: 07/02/2001 Document Revised: 11/22/2013 Document Reviewed: 05/18/2013 [facility] Patient Information 2015 [place], [place]. This information is not intended to replace advice given to you by your health care provider. Make sure you discuss any questions you have with your health care provider.  Coronavirus (COVID-19) Are you at risk?  Are you at risk for the Coronavirus (COVID-19)?  To be considered HIGH RISK for Coronavirus (COVID-19), you have to meet the following criteria:  Traveled to [place], [place], [place], [place] or [place];  and have fever, cough, and shortness of breath within the last 2 weeks of travel OR Been in close contact with a person diagnosed with COVID-19 within the last 2 weeks and have fever, cough, and shortness of breath IF YOU DO NOT MEET THESE CRITERIA, YOU ARE CONSIDERED LOW RISK FOR COVID-19.  What to do if you are HIGH RISK for COVID-19?  If you are having a medical emergency, call 911. Seek medical care right away. Before you go to a doctor's office, urgent care or emergency department, call ahead and tell them about your recent travel, contact with someone diagnosed with COVID-19, and your symptoms. You should receive instructions from your physician's office regarding next steps of care.  When you arrive at healthcare provider,  tell the healthcare staff immediately you have returned from visiting [place], [place], [place], [place] or [place]; in the last two weeks or you have been in close contact with a person diagnosed with COVID-19 in the last 2 weeks.  Tell the health care staff about your symptoms: fever, cough and shortness of breath. After you have been seen by a medical provider, you will be either: Tested for (COVID-19) and discharged home on quarantine except to seek medical care if symptoms worsen, and asked to  Stay home and avoid contact with others until you get your results (4-5 days)  Avoid travel on public transportation if possible (such as bus, train, or airplane) or Sent to the Emergency Department by EMS for evaluation, COVID-19 testing, and possible admission depending on your condition and test results.  What to do if you are LOW RISK for COVID-19?  Reduce your risk of any infection by using the same precautions used for avoiding the common cold or flu:  Wash your hands often with soap and warm water for at least 20 seconds.  If soap and water are not readily available, use an alcohol-based hand sanitizer with at least 60% alcohol.  If coughing or sneezing, cover your mouth and nose by coughing or sneezing into the elbow areas of your shirt or coat, into a tissue or into your sleeve (not your hands). Avoid shaking hands with others and consider head nods or verbal greetings only. Avoid touching your eyes, nose, or mouth with unwashed hands.  Avoid close contact with people who are sick. Avoid places or events with large numbers of people in one location, like concerts or sporting events. Carefully consider travel plans you have or are making. If you are planning any travel outside or inside the [place], visit the CDC's Travelers' Health webpage for the latest health notices. If you have some symptoms but not all symptoms, continue to monitor at home and seek medical attention if your symptoms worsen. If  you are having a medical emergency, call 911.   ADDITIONAL HEALTHCARE OPTIONS FOR [place] / e-Visit: [email or web address]         [facility]: [facility]: [id number]                   [facility]: [phone]     Safe Medications in Pregnancy   Acne: Benzoyl Peroxide Salicylic Acid  Backache/Headache: Tylenol: 2 regular strength every 4 hours OR              2 Extra strength every 6 hours  Colds/Coughs/Allergies: Benadryl (alcohol free) 25 mg every 6 hours as needed Breath right strips Claritin Cepacol throat lozenges Chloraseptic throat spray Cold-Eeze- up to three times per day Cough drops, alcohol free Flonase (by prescription only) Guaifenesin Mucinex Robitussin DM (plain only, alcohol free) Saline nasal spray/drops Sudafed (pseudoephedrine) & Actifed ** use only after [redacted] weeks gestation and if you do not have high blood pressure Tylenol Vicks Vaporub Zinc lozenges Zyrtec   Constipation: Colace Ducolax suppositories Fleet enema Glycerin suppositories Metamucil Milk of magnesia Miralax Senokot Smooth move tea  Diarrhea: Kaopectate Imodium A-D  *NO pepto Bismol  Hemorrhoids: Anusol Anusol HC Preparation H Tucks  Indigestion: Tums Maalox Mylanta Zantac  Pepcid  Insomnia: Benadryl (alcohol free) 36m every 6 hours as needed Tylenol PM Unisom, no Gelcaps  Leg Cramps: Tums MagGel  Nausea/Vomiting:  Bonine Dramamine Emetrol Ginger extract Sea bands Meclizine  Nausea medication to take during pregnancy:  Unisom (doxylamine succinate 25 mg tablets) Take one tablet daily at bedtime. If symptoms are not adequately controlled, the dose can be increased to a maximum recommended dose of two tablets daily (1/2 tablet in  the morning, 1/2 tablet mid-afternoon and one at bedtime). Vitamin B6 160m tablets. Take one  tablet twice a day (up to 200 mg per day).  Skin Rashes: Aveeno products Benadryl cream or 250mevery 6 hours as needed Calamine Lotion 1% cortisone cream  Yeast infection: Gyne-lotrimin 7 Monistat 7   **If taking multiple medications, please check labels to avoid duplicating the same active ingredients **take medication as directed on the label ** Do not exceed 4000 mg of tylenol in 24 hours **Do not take medications that contain aspirin or ibuprofen

## 2022-03-15 ENCOUNTER — Encounter: Payer: Self-pay | Admitting: Advanced Practice Midwife

## 2022-03-15 LAB — CBC/D/PLT+RPR+RH+ABO+RUBIGG...
Antibody Screen: NEGATIVE
Basophils Absolute: 0 10*3/uL (ref 0.0–0.2)
Basos: 0 %
EOS (ABSOLUTE): 0 10*3/uL (ref 0.0–0.4)
Eos: 0 %
HCV Ab: NONREACTIVE
HIV Screen 4th Generation wRfx: NONREACTIVE
Hematocrit: 43.5 % (ref 34.0–46.6)
Hemoglobin: 14.5 g/dL (ref 11.1–15.9)
Hepatitis B Surface Ag: NEGATIVE
Immature Grans (Abs): 0 10*3/uL (ref 0.0–0.1)
Immature Granulocytes: 1 %
Lymphocytes Absolute: 1.6 10*3/uL (ref 0.7–3.1)
Lymphs: 19 %
MCH: 30.1 pg (ref 26.6–33.0)
MCHC: 33.3 g/dL (ref 31.5–35.7)
MCV: 90 fL (ref 79–97)
Monocytes Absolute: 0.5 10*3/uL (ref 0.1–0.9)
Monocytes: 6 %
Neutrophils Absolute: 6.3 10*3/uL (ref 1.4–7.0)
Neutrophils: 74 %
Platelets: 309 10*3/uL (ref 150–450)
RBC: 4.82 x10E6/uL (ref 3.77–5.28)
RDW: 12.7 % (ref 11.7–15.4)
RPR Ser Ql: NONREACTIVE
Rh Factor: POSITIVE
Rubella Antibodies, IGG: 3.88 index (ref >0.99)
WBC: 8.4 10*3/uL (ref 3.4–10.8)

## 2022-03-15 LAB — HCV INTERPRETATION

## 2022-03-17 LAB — URINE CULTURE

## 2022-03-20 ENCOUNTER — Other Ambulatory Visit (INDEPENDENT_AMBULATORY_CARE_PROVIDER_SITE_OTHER): Payer: BC Managed Care – PPO

## 2022-03-20 DIAGNOSIS — Z3480 Encounter for supervision of other normal pregnancy, unspecified trimester: Secondary | ICD-10-CM

## 2022-03-20 LAB — CYTOLOGY - PAP
Chlamydia: NEGATIVE
Comment: NEGATIVE
Comment: NEGATIVE
Comment: NORMAL
Diagnosis: NEGATIVE
High risk HPV: NEGATIVE
Neisseria Gonorrhea: NEGATIVE

## 2022-03-20 NOTE — Progress Notes (Signed)
   NURSE VISIT- [facility]  SUBJECTIVE:  Madeline Nelson is a 36 y.o. [phone] female here for Panorama NIPT . She is [redacted]w[redacted]d pregnant.   OBJECTIVE:  Appears well, in no apparent distress  Blood work drawn from right [facility] without difficulty. 1 attempt(s).   ASSESSMENT: Pregnancy [redacted]w[redacted]d Panorama NIPT  PLAN: [facility] portal information given and instructed patient how to access results Follow-up as scheduled  Jobe Marker  03/20/2022 11:53 AM

## 2022-03-25 LAB — PANORAMA PRENATAL TEST FULL PANEL:PANORAMA TEST PLUS 5 ADDITIONAL MICRODELETIONS: FETAL FRACTION: 21.9

## 2022-04-11 ENCOUNTER — Ambulatory Visit (INDEPENDENT_AMBULATORY_CARE_PROVIDER_SITE_OTHER): Payer: BC Managed Care – PPO | Admitting: Advanced Practice Midwife

## 2022-04-11 VITALS — BP 112/72 | HR 73 | Wt 124.0 lb

## 2022-04-11 DIAGNOSIS — Z3A15 15 weeks gestation of pregnancy: Secondary | ICD-10-CM

## 2022-04-11 DIAGNOSIS — Z363 Encounter for antenatal screening for malformations: Secondary | ICD-10-CM

## 2022-04-11 DIAGNOSIS — Z3482 Encounter for supervision of other normal pregnancy, second trimester: Secondary | ICD-10-CM

## 2022-04-11 MED ORDER — PROMETHAZINE HCL 25 MG PO TABS
25.0000 mg | ORAL_TABLET | Freq: Four times a day (QID) | ORAL | 1 refills | Status: DC | PRN
Start: 2022-04-11 — End: 2022-07-25

## 2022-04-11 NOTE — Progress Notes (Signed)
   LOW-RISK PREGNANCY VISIT Patient name: Waylon Koffler MRN [id number]  Date of birth: [birth year] Chief Complaint:   Routine Prenatal Visit  History of Present Illness:   Vincenta Steffey is a 36 y.o. [phone] female at [redacted]w[redacted]d with an Estimated Date of Delivery: 09/28/22 being seen today for ongoing management of a low-risk pregnancy.  Today she reports no complaints. Contractions: Not present. Vag. Bleeding: None.   . denies leaking of fluid. Review of Systems:   Pertinent items are noted in HPI Denies abnormal vaginal discharge w/ itching/odor/irritation, headaches, visual changes, shortness of breath, chest pain, abdominal pain, severe nausea/vomiting, or problems with urination or bowel movements unless otherwise stated above. Pertinent History Reviewed:  Reviewed past medical,surgical, social, obstetrical and family history.  Reviewed problem list, medications and allergies. Physical Assessment:   Vitals:   04/11/22 1005  BP: 112/72  Pulse: 73  Weight: 124 lb (56.2 kg)  Body mass index is 21.28 kg/m.        Physical Examination:   General appearance: Well appearing, and in no distress  Mental status: Alert, oriented to person, place, and time  Skin: Warm & dry  Cardiovascular: Normal heart rate noted  Respiratory: Normal respiratory effort, no distress  Abdomen: Soft, gravid, nontender  Pelvic: Cervical exam deferred         Extremities: Edema: None  Fetal Status: Fetal Heart Rate (bpm): 150        Chaperone: n/a    No results found for this or any previous visit (from the past 24 hour(s)).  Assessment & Plan:  1) Low-risk pregnancy [id number] at [redacted]w[redacted]d with an Estimated Date of Delivery: 09/28/22   2) Anxiety, doing OK now, doesn't want meds; let me know if it increases and I'll make a referral to [facility]   Meds:  Meds ordered this encounter  Medications   promethazine (PHENERGAN) 25 MG tablet    Sig: Take 1 tablet (25 mg total) by mouth every 6 (six) hours as needed for nausea  or vomiting.    Dispense:  30 tablet    Refill:  1    Order Specific Question:   Supervising Provider    Answer:   Florian Buff [[id number]]   Labs/procedures today: none  Plan:  Continue routine obstetrical care  Next visit: prefers will be in person for anatomy scan     Reviewed: Preterm labor symptoms and general obstetric precautions including but not limited to vaginal bleeding, contractions, leaking of fluid and fetal movement were reviewed in detail with the patient.  All questions were answered. Has home bp cuff.. Check bp weekly, let us know if >140/90.   Follow-up: Return in about 4 weeks (around 05/09/2022) for UX:[id number], LROB.  Future Appointments  Date Time Provider [place]  05/08/2022  3:00 PM [facility] [place] [facility] None  05/08/2022  3:50 PM Hillard Danker Myles Rosenthal, PA-C [facility]    Orders Placed This Encounter  Procedures   [facility] + [phone] DNP, CNM 04/11/2022 2:11 PM

## 2022-04-11 NOTE — Patient Instructions (Signed)
[employer], I greatly value your feedback.  If you receive a survey following your visit with [place] today, we appreciate you taking the time to fill it out.  Thanks, Nigel Berthold, CNM     [place] It is now [place] at [facility] ([place], [place] [id number]) Entrance located off of [address] parking   Go to [email or web address] to register for FREE online childbirth classes    Second Trimester of Pregnancy The second trimester is from week 14 through week 27 (months 4 through 6). The second trimester is often a time when you feel your best. Your body has adjusted to being pregnant, and you begin to feel better physically. Usually, morning sickness has lessened or quit completely, you may have more energy, and you may have an increase in appetite. The second trimester is also a time when the fetus is growing rapidly. At the end of the sixth month, the fetus is about 9 inches long and weighs about 1 pounds. You will likely begin to feel the baby move (quickening) between 16 and 20 weeks of pregnancy. Body changes during your second trimester Your body continues to go through many changes during your second trimester. The changes vary from woman to woman. Your weight will continue to increase. You will notice your lower abdomen bulging out. You may begin to get stretch marks on your hips, abdomen, and breasts. You may develop headaches that can be relieved by medicines. The medicines should be approved by your health care provider. You may urinate more often because the fetus is pressing on your bladder. You may develop or continue to have heartburn as a result of your pregnancy. You may develop constipation because certain hormones are causing the muscles that push waste through your intestines to slow down. You may develop hemorrhoids or swollen, bulging veins (varicose veins). You may have back pain. This is  caused by: Weight gain. Pregnancy hormones that are relaxing the joints in your pelvis. A shift in weight and the muscles that support your balance. Your breasts will continue to grow and they will continue to become tender. Your gums may bleed and may be sensitive to brushing and flossing. Dark spots or blotches (chloasma, mask of pregnancy) may develop on your face. This will likely fade after the baby is born. A dark line from your belly button to the pubic area (linea nigra) may appear. This will likely fade after the baby is born. You may have changes in your hair. These can include thickening of your hair, rapid growth, and changes in texture. Some women also have hair loss during or after pregnancy, or hair that feels dry or thin. Your hair will most likely return to normal after your baby is born.  What to expect at prenatal visits During a routine prenatal visit: You will be weighed to make sure you and the fetus are growing normally. Your blood pressure will be taken. Your abdomen will be measured to track your baby's growth. The fetal heartbeat will be listened to. Any test results from the previous visit will be discussed.  Your health care provider may ask you: How you are feeling. If you are feeling the baby move. If you have had any abnormal symptoms, such as leaking fluid, bleeding, severe headaches, or abdominal cramping. If you are using any tobacco products, including cigarettes, chewing tobacco, and electronic cigarettes. If you have any questions.  Other tests that  may be performed during your second trimester include: Blood tests that check for: Low iron levels (anemia). High blood sugar that affects pregnant women (gestational diabetes) between 66 and 28 weeks. Rh antibodies. This is to check for a protein on red blood cells (Rh factor). Urine tests to check for infections, diabetes, or protein in the urine. An ultrasound to confirm the proper growth and  development of the baby. An amniocentesis to check for possible genetic problems. Fetal screens for spina bifida and Down syndrome. HIV (human immunodeficiency virus) testing. Routine prenatal testing includes screening for HIV, unless you choose not to have this test.  Follow these instructions at home: Medicines Follow your health care provider's instructions regarding medicine use. Specific medicines may be either safe or unsafe to take during pregnancy. Take a prenatal vitamin that contains at least 600 micrograms (mcg) of folic acid. If you develop constipation, try taking a stool softener if your health care provider approves. Eating and drinking Eat a balanced diet that includes fresh fruits and vegetables, whole grains, good sources of protein such as meat, eggs, or tofu, and low-fat dairy. Your health care provider will help you determine the amount of weight gain that is right for you. Avoid raw meat and uncooked cheese. These carry germs that can cause birth defects in the baby. If you have low calcium intake from food, talk to your health care provider about whether you should take a daily calcium supplement. Limit foods that are high in fat and processed sugars, such as fried and sweet foods. To prevent constipation: Drink enough fluid to keep your urine clear or pale yellow. Eat foods that are high in fiber, such as fresh fruits and vegetables, whole grains, and beans. Activity Exercise only as directed by your health care provider. Most women can continue their usual exercise routine during pregnancy. Try to exercise for 30 minutes at least 5 days a week. Stop exercising if you experience uterine contractions. Avoid heavy lifting, wear low heel shoes, and practice good posture. A sexual relationship may be continued unless your health care provider directs you otherwise. Relieving pain and discomfort Wear a good support bra to prevent discomfort from breast tenderness. Take  warm sitz baths to soothe any pain or discomfort caused by hemorrhoids. Use hemorrhoid cream if your health care provider approves. Rest with your legs elevated if you have leg cramps or low back pain. If you develop varicose veins, wear support hose. Elevate your feet for 15 minutes, 3-4 times a day. Limit salt in your diet. Prenatal Care Write down your questions. Take them to your prenatal visits. Keep all your prenatal visits as told by your health care provider. This is important. Safety Wear your seat belt at all times when driving. Make a list of emergency phone numbers, including numbers for family, friends, the hospital, and police and fire departments. General instructions Ask your health care provider for a referral to a local prenatal education class. Begin classes no later than the beginning of month 6 of your pregnancy. Ask for help if you have counseling or nutritional needs during pregnancy. Your health care provider can offer advice or refer you to specialists for help with various needs. Do not use hot tubs, steam rooms, or saunas. Do not douche or use tampons or scented sanitary pads. Do not cross your legs for long periods of time. Avoid cat litter boxes and soil used by cats. These carry germs that can cause birth defects in the baby and  possibly loss of the fetus by miscarriage or stillbirth. Avoid all smoking, herbs, alcohol, and unprescribed drugs. Chemicals in these products can affect the formation and growth of the baby. Do not use any products that contain nicotine or tobacco, such as cigarettes and e-cigarettes. If you need help quitting, ask your health care provider. Visit your dentist if you have not gone yet during your pregnancy. Use a soft toothbrush to brush your teeth and be gentle when you floss. Contact a health care provider if: You have dizziness. You have mild pelvic cramps, pelvic pressure, or nagging pain in the abdominal area. You have persistent  nausea, vomiting, or diarrhea. You have a bad smelling vaginal discharge. You have pain when you urinate. Get help right away if: You have a fever. You are leaking fluid from your vagina. You have spotting or bleeding from your vagina. You have severe abdominal cramping or pain. You have rapid weight gain or weight loss. You have shortness of breath with chest pain. You notice sudden or extreme swelling of your face, hands, ankles, feet, or legs. You have not felt your baby move in over an hour. You have severe headaches that do not go away when you take medicine. You have vision changes. Summary The second trimester is from week 14 through week 27 (months 4 through 6). It is also a time when the fetus is growing rapidly. Your body goes through many changes during pregnancy. The changes vary from woman to woman. Avoid all smoking, herbs, alcohol, and unprescribed drugs. These chemicals affect the formation and growth your baby. Do not use any tobacco products, such as cigarettes, chewing tobacco, and e-cigarettes. If you need help quitting, ask your health care provider. Contact your health care provider if you have any questions. Keep all prenatal visits as told by your health care provider. This is important. This information is not intended to replace advice given to you by your health care provider. Make sure you discuss any questions you have with your health care provider.

## 2022-04-25 ENCOUNTER — Encounter: Payer: Self-pay | Admitting: Advanced Practice Midwife

## 2022-04-29 ENCOUNTER — Other Ambulatory Visit: Payer: Self-pay | Admitting: Advanced Practice Midwife

## 2022-04-29 MED ORDER — SERTRALINE HCL 25 MG PO TABS: 25.0000 mg | ORAL_TABLET | Freq: Every day | ORAL | 6 refills | Status: DC

## 2022-05-08 ENCOUNTER — Ambulatory Visit (INDEPENDENT_AMBULATORY_CARE_PROVIDER_SITE_OTHER): Payer: BC Managed Care – PPO | Admitting: Medical

## 2022-05-08 ENCOUNTER — Ambulatory Visit (INDEPENDENT_AMBULATORY_CARE_PROVIDER_SITE_OTHER): Payer: BC Managed Care – PPO

## 2022-05-08 VITALS — BP 118/79 | HR 69 | Wt 121.0 lb

## 2022-05-08 DIAGNOSIS — Z363 Encounter for antenatal screening for malformations: Secondary | ICD-10-CM

## 2022-05-08 DIAGNOSIS — F429 Obsessive-compulsive disorder, unspecified: Secondary | ICD-10-CM

## 2022-05-08 DIAGNOSIS — Z98891 History of uterine scar from previous surgery: Secondary | ICD-10-CM

## 2022-05-08 DIAGNOSIS — Z3A19 19 weeks gestation of pregnancy: Secondary | ICD-10-CM | POA: Diagnosis not present

## 2022-05-08 DIAGNOSIS — I456 Pre-excitation syndrome: Secondary | ICD-10-CM

## 2022-05-08 DIAGNOSIS — F419 Anxiety disorder, unspecified: Secondary | ICD-10-CM

## 2022-05-08 DIAGNOSIS — R002 Palpitations: Secondary | ICD-10-CM

## 2022-05-08 DIAGNOSIS — Z3480 Encounter for supervision of other normal pregnancy, unspecified trimester: Secondary | ICD-10-CM

## 2022-05-08 DIAGNOSIS — G479 Sleep disorder, unspecified: Secondary | ICD-10-CM

## 2022-05-08 MED ORDER — SERTRALINE HCL 50 MG PO TABS: 50.0000 mg | ORAL_TABLET | Freq: Every day | ORAL | 3 refills | Status: DC

## 2022-05-08 NOTE — Progress Notes (Signed)
[place] 30+1 wks,cephalic,anterior placenta gr 0,cervical length 3 cm,SVP of fluid 5.8 cm,FHR 144 bpm,normal ovaries,EFW 327 g 70%,anatomy complete,no obvious abnormalities

## 2022-05-09 NOTE — Progress Notes (Signed)
   PRENATAL VISIT NOTE  Subjective:  Madeline Nelson is a 36 y.o. [phone] at [redacted]w[redacted]d being seen today for ongoing prenatal care.  She is currently monitored for the following issues for this high-risk pregnancy and has Palpitations; WPW (Wolff-Parkinson-White syndrome); OCD (obsessive compulsive disorder); Scoliosis; Previous cesarean section; Anxiety w/ panic attacks; and Encounter for supervision of normal pregnancy, antepartum on their problem list.  Patient reports fatigue.  Contractions: Not present. Vag. Bleeding: None.  Movement: Present. Denies leaking of fluid.   The following portions of the patient's history were reviewed and updated as appropriate: allergies, current medications, past family history, past medical history, past social history, past surgical history and problem list.   Objective:   Vitals:   05/08/22 1549  BP: 118/79  Pulse: 69  Weight: 121 lb (54.9 kg)    Fetal Status: Fetal Heart Rate (bpm): 154   Movement: Present     General:  Alert, oriented and cooperative. Patient is in no acute distress.  Skin: Skin is warm and dry. No rash noted.   Cardiovascular: Normal heart rate noted  Respiratory: Normal respiratory effort, no problems with respiration noted  Abdomen: Soft, gravid, appropriate for gestational age.  Pain/Pressure: Absent     Pelvic: Cervical exam deferred        Extremities: Normal range of motion.  Edema: None  Mental Status: Normal mood and affect. Normal behavior. Normal judgment and thought content.   Assessment and Plan:  Pregnancy: [id number] at [redacted]w[redacted]d 1. Supervision of other normal pregnancy, antepartum - Anatomy US today, preliminary results discussed  2. Previous cesarean section - Plan for repeat #4 at 39 weeks   3. Anxiety w/ panic attacks - Increased dose based on symptoms and history  - sertraline (ZOLOFT) 50 MG tablet; Take 1 tablet (50 mg total) by mouth daily.  Dispense: 30 tablet; Refill: 3  4. Obsessive-compulsive disorder,  unspecified type  5. WPW (Wolff-Parkinson-White syndrome) - Cleared by cardiology   6. Palpitations  7. Sleep difficulties - OTC recommendations given first  8. [redacted] weeks gestation of pregnancy  Preterm labor symptoms and general obstetric precautions including but not limited to vaginal bleeding, contractions, leaking of fluid and fetal movement were reviewed in detail with the patient. Please refer to After Visit Summary for other counseling recommendations.   Return in about 4 weeks (around 06/05/2022) for [facility] MD only, In-Person.  Future Appointments  Date Time Provider [place]  06/05/2022 10:30 AM Janyth Pupa, DO [facility]    Kerry Hough, PA-C

## 2022-06-05 ENCOUNTER — Ambulatory Visit (INDEPENDENT_AMBULATORY_CARE_PROVIDER_SITE_OTHER): Payer: BC Managed Care – PPO | Admitting: Obstetrics & Gynecology

## 2022-06-05 ENCOUNTER — Encounter: Payer: Self-pay | Admitting: Obstetrics & Gynecology

## 2022-06-05 VITALS — BP 120/81 | HR 96 | Wt 123.6 lb

## 2022-06-05 DIAGNOSIS — Z3480 Encounter for supervision of other normal pregnancy, unspecified trimester: Secondary | ICD-10-CM

## 2022-06-05 DIAGNOSIS — Z3A23 23 weeks gestation of pregnancy: Secondary | ICD-10-CM

## 2022-06-05 NOTE — Progress Notes (Signed)
   LOW-RISK PREGNANCY VISIT Patient name: Madeline Nelson MRN [id number]  Date of birth: Jul 28, 1985 Chief Complaint:   Routine Prenatal Visit  History of Present Illness:   Madeline Nelson is a 36 y.o. [phone] female at [redacted]w[redacted]d with an Estimated Date of Delivery: 09/28/22 being seen today for ongoing management of a low-risk pregnancy.   -prior C-section x 3 -anxiety- on zoloft Did not discuss at today's visit, mood appropriate     03/14/2022    9:38 AM 01/08/2021   10:47 AM  Depression screen PHQ 2/9  Decreased Interest 1 0  Down, Depressed, Hopeless 1 0  PHQ - 2 Score 2 0  Altered sleeping 0 0  Tired, decreased energy 1 1  Change in appetite 0 0  Feeling bad or failure about yourself  0 0  Trouble concentrating 0 0  Moving slowly or fidgety/restless 0 0  Suicidal thoughts 0 0  PHQ-9 Score 3 1    Today she reports no complaints. Contractions: Not present. Vag. Bleeding: None.  Movement: Present. denies leaking of fluid. Review of Systems:   Pertinent items are noted in HPI Denies abnormal vaginal discharge w/ itching/odor/irritation, headaches, visual changes, shortness of breath, chest pain, abdominal pain, severe nausea/vomiting, or problems with urination or bowel movements unless otherwise stated above. Pertinent History Reviewed:  Reviewed past medical,surgical, social, obstetrical and family history.  Reviewed problem list, medications and allergies.  Physical Assessment:   Vitals:   06/05/22 1034  BP: 120/81  Pulse: 96  Weight: 123 lb 9.6 oz (56.1 kg)  Body mass index is 21.22 kg/m.        Physical Examination:   General appearance: Well appearing, and in no distress  Mental status: Alert, oriented to person, place, and time  Skin: Warm & dry  Respiratory: Normal respiratory effort, no distress  Abdomen: Soft, gravid, nontender  Pelvic: Cervical exam deferred         Extremities:    Psych:  mood and affect appropriate  Fetal Status: Fetal Heart Rate (bpm): 150  Fundal Height: 22 cm Movement: Present    Chaperone: n/a    No results found for this or any previous visit (from the past 24 hour(s)).   Assessment & Plan:  1) Low-risk pregnancy [id number] at [redacted]w[redacted]d with an Estimated Date of Delivery: 09/28/22   2) Prior C-section x 3- plan for repeat with BTL []  plan to obtain prior operative note Pt does not recall any comment/concerns regarding adhesions   Meds: No orders of the defined types were placed in this encounter.  Labs/procedures today: none  Plan:  Continue routine obstetrical care  Next visit: prefers in person    Reviewed: Preterm labor symptoms and general obstetric precautions including but not limited to vaginal bleeding, contractions, leaking of fluid and fetal movement were reviewed in detail with the patient.  All questions were answered. Pt has home bp cuff. Check bp weekly, let know if >140/90.   Follow-up: Return in about 4 weeks (around 07/03/2022) for LROB visit and PN-2, []  records from [facility] (request operative note).  No orders of the defined types were placed in this encounter.   , DO Attending Obstetrician & Gynecologist, [facility] [place], [facility]

## 2022-07-03 ENCOUNTER — Ambulatory Visit (INDEPENDENT_AMBULATORY_CARE_PROVIDER_SITE_OTHER): Payer: BC Managed Care – PPO | Admitting: Advanced Practice Midwife

## 2022-07-03 ENCOUNTER — Encounter: Payer: Self-pay | Admitting: Advanced Practice Midwife

## 2022-07-03 ENCOUNTER — Other Ambulatory Visit: Payer: BC Managed Care – PPO

## 2022-07-03 VITALS — BP 119/75 | HR 80 | Wt 129.0 lb

## 2022-07-03 DIAGNOSIS — Z131 Encounter for screening for diabetes mellitus: Secondary | ICD-10-CM

## 2022-07-03 DIAGNOSIS — Z3A27 27 weeks gestation of pregnancy: Secondary | ICD-10-CM

## 2022-07-03 DIAGNOSIS — Z3480 Encounter for supervision of other normal pregnancy, unspecified trimester: Secondary | ICD-10-CM

## 2022-07-03 NOTE — Patient Instructions (Signed)
Baxter Hire, thank you for choosing our office today! We appreciate the opportunity to meet your healthcare needs. You may receive a short survey by mail, e-mail, or through [employer]. If you are happy with your care we would appreciate if you could take just a few minutes to complete the survey questions. We read all of your comments and take your feedback very seriously. Thank you again for choosing our office.  Center for [employer] Team at [facility]  [facility] at [facility] ([address] [place], [place] [address]) Entrance C, located off of [address] Free 24/7 valet parking   CLASSES: Go to [email or web address] to register for classes (childbirth, breastfeeding, waterbirth, infant CPR, daddy bootcamp, etc.)  Call the office [phone]) or go to [facility] if: You begin to have strong, frequent contractions Your water breaks.  Sometimes it is a big gush of fluid, sometimes it is just a trickle that keeps getting your panties wet or running down your legs You have vaginal bleeding.  It is normal to have a small amount of spotting if your cervix was checked.  You don't feel your baby moving like normal.  If you don't, get you something to eat and drink and lay down and focus on feeling your baby move.   If your baby is still not moving like normal, you should call the office or go to [facility].  Call the office [phone]) or go to [facility] hospital for these signs of pre-eclampsia: Severe headache that does not go away with Tylenol Visual changes- seeing spots, double, blurred vision Pain under your right breast or upper abdomen that does not go away with Tums or heartburn medicine Nausea and/or vomiting Severe swelling in your hands, feet, and face   Tdap Vaccine It is recommended that you get the Tdap vaccine during the third trimester of EACH pregnancy to help protect your baby from getting pertussis (whooping cough) 27-36 weeks is the BEST time to do  this so that you can pass the protection on to your baby. During pregnancy is better than after pregnancy, but if you are unable to get it during pregnancy it will be offered at the hospital.  You can get this vaccine with [place], at the health department, your family doctor, or some local pharmacies Everyone who will be around your baby should also be up-to-date on their vaccines before the baby comes. Adults (who are not pregnant) only need 1 dose of Tdap during adulthood.   [facility] Pediatricians/Family Doctors [facility]): [address] Colette Ribas, [phone]           [facility]: [address], [phone]                [facility]): [address], [phone] (call to ask if accepting patients) [facility] Department: [address], [place], [phone]    [facility] Pediatricians/Family Doctors [facility] [phone], [phone] [facility]: [address] [place], [phone] [facility]: [address], [phone]  [facility] Doctors  [facility]): [phone] [facility]: [address], [phone]   [facility]: [address] [phone]  [facility] Family Doctors  [facility]: [phone], [phone]  Home Blood Pressure Monitoring for Patients   Your provider has recommended that you check your  blood pressure (BP) at least once a week at home. If you do not have a blood pressure cuff at home, one will be provided for you. Contact your provider if you have not received your monitor within 1 week.   Helpful Tips for Accurate Home Blood Pressure Checks  Don't smoke, exercise, or drink caffeine 30 minutes before checking your BP Use the restroom before checking your BP (a full bladder can raise your  pressure) Relax in a comfortable upright chair Feet on the ground Left arm resting comfortably on a flat surface at the level of your heart Legs uncrossed Back supported Sit quietly and don't talk Place the cuff on your bare arm Adjust snuggly, so that only two fingertips can fit between your skin and the top of the cuff Check 2 readings separated by at least one minute Keep a log of your BP readings For a visual, please reference this diagram: http://ccnc.care/bpdiagram  Provider Name: [facility]     Phone: [phone]  Zone 1: ALL CLEAR  Continue to monitor your symptoms:  BP reading is less than 140 (top number) or less than 90 (bottom number)  No right upper stomach pain No headaches or seeing spots No feeling nauseated or throwing up No swelling in face and hands  Zone 2: CAUTION Call your doctor's office for any of the following:  BP reading is greater than 140 (top number) or greater than 90 (bottom number)  Stomach pain under your ribs in the middle or right side Headaches or seeing spots Feeling nauseated or throwing up Swelling in face and hands  Zone 3: EMERGENCY  Seek immediate medical care if you have any of the following:  BP reading is greater than160 (top number) or greater than 110 (bottom number) Severe headaches not improving with Tylenol Serious difficulty catching your breath Any worsening symptoms from Zone 2   Third Trimester of Pregnancy The third trimester is from week 29 through week 42, months 7 through 9. The third trimester is a time when the fetus is growing rapidly. At the end of the ninth month, the fetus is about 20 inches in length and weighs 6-10 pounds.  BODY CHANGES Your body goes through many changes during pregnancy. The changes vary from woman to woman.  Your weight will continue to increase. You can expect to gain 25-35 pounds (11-16 kg) by the end of the pregnancy. You may begin to get stretch marks on your hips, abdomen,  and breasts. You may urinate more often because the fetus is moving lower into your pelvis and pressing on your bladder. You may develop or continue to have heartburn as a result of your pregnancy. You may develop constipation because certain hormones are causing the muscles that push waste through your intestines to slow down. You may develop hemorrhoids or swollen, bulging veins (varicose veins). You may have pelvic pain because of the weight gain and pregnancy hormones relaxing your joints between the bones in your pelvis. Backaches may result from overexertion of the muscles supporting your posture. You may have changes in your hair. These can include thickening of your hair, rapid growth, and changes in texture. Some women also have hair loss during or after pregnancy, or hair that feels dry or thin. Your hair will most likely return to normal after your baby is born. Your breasts will continue to grow and be tender. A yellow discharge may leak from your breasts called colostrum. Your belly button may stick out. You may   feel short of breath because of your expanding uterus. You may notice the fetus "dropping," or moving lower in your abdomen. You may have a bloody mucus discharge. This usually occurs a few days to a week before labor begins. Your cervix becomes thin and soft (effaced) near your due date. WHAT TO EXPECT AT YOUR PRENATAL EXAMS  You will have prenatal exams every 2 weeks until week 36. Then, you will have weekly prenatal exams. During a routine prenatal visit: You will be weighed to make sure you and the fetus are growing normally. Your blood pressure is taken. Your abdomen will be measured to track your baby's growth. The fetal heartbeat will be listened to. Any test results from the previous visit will be discussed. You may have a cervical check near your due date to see if you have effaced. At around 36 weeks, your caregiver will check your cervix. At the same time, your  caregiver will also perform a test on the secretions of the vaginal tissue. This test is to determine if a type of bacteria, Group B streptococcus, is present. Your caregiver will explain this further. Your caregiver may ask you: What your birth plan is. How you are feeling. If you are feeling the baby move. If you have had any abnormal symptoms, such as leaking fluid, bleeding, severe headaches, or abdominal cramping. If you have any questions. Other tests or screenings that may be performed during your third trimester include: Blood tests that check for low iron levels (anemia). Fetal testing to check the health, activity level, and growth of the fetus. Testing is done if you have certain medical conditions or if there are problems during the pregnancy. FALSE LABOR You may feel small, irregular contractions that eventually go away. These are called Braxton Hicks contractions, or false labor. Contractions may last for hours, days, or even weeks before true labor sets in. If contractions come at regular intervals, intensify, or become painful, it is best to be seen by your caregiver.  SIGNS OF LABOR  Menstrual-like cramps. Contractions that are 5 minutes apart or less. Contractions that start on the top of the uterus and spread down to the lower abdomen and back. A sense of increased pelvic pressure or back pain. A watery or bloody mucus discharge that comes from the vagina. If you have any of these signs before the 37th week of pregnancy, call your caregiver right away. You need to go to the hospital to get checked immediately. HOME CARE INSTRUCTIONS  Avoid all smoking, herbs, alcohol, and unprescribed drugs. These chemicals affect the formation and growth of the baby. Follow your caregiver's instructions regarding medicine use. There are medicines that are either safe or unsafe to take during pregnancy. Exercise only as directed by your caregiver. Experiencing uterine cramps is a good sign to  stop exercising. Continue to eat regular, healthy meals. Wear a good support bra for breast tenderness. Do not use hot tubs, steam rooms, or saunas. Wear your seat belt at all times when driving. Avoid raw meat, uncooked cheese, cat litter boxes, and soil used by cats. These carry germs that can cause birth defects in the baby. Take your prenatal vitamins. Try taking a stool softener (if your caregiver approves) if you develop constipation. Eat more high-fiber foods, such as fresh vegetables or fruit and whole grains. Drink plenty of fluids to keep your urine clear or pale yellow. Take warm sitz baths to soothe any pain or discomfort caused by hemorrhoids. Use hemorrhoid cream if   your caregiver approves. If you develop varicose veins, wear support hose. Elevate your feet for 15 minutes, 3-4 times a day. Limit salt in your diet. Avoid heavy lifting, wear low heal shoes, and practice good posture. Rest a lot with your legs elevated if you have leg cramps or low back pain. Visit your dentist if you have not gone during your pregnancy. Use a soft toothbrush to brush your teeth and be gentle when you floss. A sexual relationship may be continued unless your caregiver directs you otherwise. Do not travel far distances unless it is absolutely necessary and only with the approval of your caregiver. Take prenatal classes to understand, practice, and ask questions about the labor and delivery. Make a trial run to the hospital. Pack your hospital bag. Prepare the baby's nursery. Continue to go to all your prenatal visits as directed by your caregiver. SEEK MEDICAL CARE IF: You are unsure if you are in labor or if your water has broken. You have dizziness. You have mild pelvic cramps, pelvic pressure, or nagging pain in your abdominal area. You have persistent nausea, vomiting, or diarrhea. You have a bad smelling vaginal discharge. You have pain with urination. SEEK IMMEDIATE MEDICAL CARE IF:  You  have a fever. You are leaking fluid from your vagina. You have spotting or bleeding from your vagina. You have severe abdominal cramping or pain. You have rapid weight loss or gain. You have shortness of breath with chest pain. You notice sudden or extreme swelling of your face, hands, ankles, feet, or legs. You have not felt your baby move in over an hour. You have severe headaches that do not go away with medicine. You have vision changes. Document Released: 07/02/2001 Document Revised: 07/13/2013 Document Reviewed: 09/08/2012 [facility] Patient Information 2015 [place], [place]. This information is not intended to replace advice given to you by your health care provider. Make sure you discuss any questions you have with your health care provider.

## 2022-07-03 NOTE — Progress Notes (Signed)
   LOW-RISK PREGNANCY VISIT Patient name: Ryonna Cimini MRN [id number]  Date of birth: 02/02/1986 Chief Complaint:   Routine Prenatal Visit (PN2 today)  History of Present Illness:   Jakyrah Holladay is a 36 y.o. [phone] female at [redacted]w[redacted]d with an Estimated Date of Delivery: 09/28/22 being seen today for ongoing management of a low-risk pregnancy.  Today she reports no complaints. Contractions: Not present. Vag. Bleeding: None.  Movement: Present. denies leaking of fluid. Review of Systems:   Pertinent items are noted in HPI Denies abnormal vaginal discharge w/ itching/odor/irritation, headaches, visual changes, shortness of breath, chest pain, abdominal pain, severe nausea/vomiting, or problems with urination or bowel movements unless otherwise stated above. Pertinent History Reviewed:  Reviewed past medical,surgical, social, obstetrical and family history.  Reviewed problem list, medications and allergies. Physical Assessment:   Vitals:   07/03/22 0838  BP: 119/75  Pulse: 80  Weight: 129 lb (58.5 kg)  Body mass index is 22.14 kg/m.        Physical Examination:   General appearance: Well appearing, and in no distress  Mental status: Alert, oriented to person, place, and time  Skin: Warm & dry  Cardiovascular: Normal heart rate noted  Respiratory: Normal respiratory effort, no distress  Abdomen: Soft, gravid, nontender  Pelvic: Cervical exam deferred         Extremities:    Fetal Status: Fetal Heart Rate (bpm): 134 Fundal Height: 26 cm Movement: Present    No results found for this or any previous visit (from the past 24 hour(s)).  Assessment & Plan:  1) Low-risk pregnancy [id number] at [redacted]w[redacted]d with an Estimated Date of Delivery: 09/28/22   2) Hx C/S x 3, plans for LTCS w BTL (private ins); will meet w [facility] next visit to discuss scheduling  3) Anxiety, taking Zoloft 50mg    Meds: No orders of the defined types were placed in this encounter.  Labs/procedures today: PN2; declines flu &  Tdap  Plan:  Continue routine obstetrical care   Reviewed: Preterm labor symptoms and general obstetric precautions including but not limited to vaginal bleeding, contractions, leaking of fluid and fetal movement were reviewed in detail with the patient.  All questions were answered. Has home bp cuff. Check bp weekly, let know if >140/90.   Follow-up: Return in about 3 weeks (around 07/24/2022) for LROB, in person w [facility] if possible.  No orders of the defined types were placed in this encounter.  09/22/2022 CNM 07/03/2022 9:02 AM

## 2022-07-04 LAB — HIV ANTIBODY (ROUTINE TESTING W REFLEX): HIV Screen 4th Generation wRfx: NONREACTIVE

## 2022-07-04 LAB — CBC
Hematocrit: 39 % (ref 34.0–46.6)
Hemoglobin: 13.2 g/dL (ref 11.1–15.9)
MCH: 30.8 pg (ref 26.6–33.0)
MCHC: 33.8 g/dL (ref 31.5–35.7)
MCV: 91 fL (ref 79–97)
Platelets: 272 10*3/uL (ref 150–450)
RBC: 4.28 x10E6/uL (ref 3.77–5.28)
RDW: 12 % (ref 11.7–15.4)
WBC: 10.4 10*3/uL (ref 3.4–10.8)

## 2022-07-04 LAB — RPR: RPR Ser Ql: NONREACTIVE

## 2022-07-04 LAB — GLUCOSE TOLERANCE, 2 HOURS W/ 1HR
Glucose, 1 hour: 142 mg/dL (ref 70–179)
Glucose, 2 hour: 88 mg/dL (ref 70–152)
Glucose, Fasting: 79 mg/dL (ref 70–91)

## 2022-07-04 LAB — ANTIBODY SCREEN: Antibody Screen: NEGATIVE

## 2022-07-25 ENCOUNTER — Ambulatory Visit (INDEPENDENT_AMBULATORY_CARE_PROVIDER_SITE_OTHER): Payer: BC Managed Care – PPO | Admitting: Obstetrics & Gynecology

## 2022-07-25 ENCOUNTER — Encounter: Payer: Self-pay | Admitting: Obstetrics & Gynecology

## 2022-07-25 VITALS — BP 120/83 | HR 73 | Wt 134.5 lb

## 2022-07-25 DIAGNOSIS — Z3480 Encounter for supervision of other normal pregnancy, unspecified trimester: Secondary | ICD-10-CM

## 2022-07-25 MED ORDER — PROMETHAZINE HCL 25 MG PO TABS: 25.0000 mg | ORAL_TABLET | Freq: Four times a day (QID) | ORAL | 1 refills | Status: DC | PRN

## 2022-07-25 NOTE — Addendum Note (Signed)
Addended by: Florian Buff on: 07/25/2022 10:28 AM   Modules accepted: Orders

## 2022-07-25 NOTE — Progress Notes (Signed)
   LOW-RISK PREGNANCY VISIT Patient name: Madeline Nelson MRN [id number]  Date of birth: [birth year] Chief Complaint:   Routine Prenatal Visit (+ braxton hick's contractions)  History of Present Illness:   Madeline Nelson is a 37 y.o. [phone] female at [redacted]w[redacted]d with an Estimated Date of Delivery: 09/28/22 being seen today for ongoing management of a low-risk pregnancy.     03/14/2022    9:38 AM 01/08/2021   10:47 AM  Depression screen PHQ 2/9  Decreased Interest 1 0  Down, Depressed, Hopeless 1 0  PHQ - 2 Score 2 0  Altered sleeping 0 0  Tired, decreased energy 1 1  Change in appetite 0 0  Feeling bad or failure about yourself  0 0  Trouble concentrating 0 0  Moving slowly or fidgety/restless 0 0  Suicidal thoughts 0 0  PHQ-9 Score 3 1    Today she reports no complaints. Contractions: Irritability. Vag. Bleeding: None.  Movement: Present. denies leaking of fluid. Review of Systems:   Pertinent items are noted in HPI Denies abnormal vaginal discharge w/ itching/odor/irritation, headaches, visual changes, shortness of breath, chest pain, abdominal pain, severe nausea/vomiting, or problems with urination or bowel movements unless otherwise stated above. Pertinent History Reviewed:  Reviewed past medical,surgical, social, obstetrical and family history.  Reviewed problem list, medications and allergies. Physical Assessment:   Vitals:   07/25/22 0929  BP: 120/83  Pulse: 73  Weight: 134 lb 8 oz (61 kg)  Body mass index is 23.09 kg/m.        Physical Examination:   General appearance: Well appearing, and in no distress  Mental status: Alert, oriented to person, place, and time  Skin: Warm & dry  Cardiovascular: Normal heart rate noted  Respiratory: Normal respiratory effort, no distress  Abdomen: Soft, gravid, nontender  Pelvic: Cervical exam deferred         Extremities:    Fetal Status: Fetal Heart Rate (bpm): 140 Fundal Height: 28 cm Movement: Present    Chaperone: n/a    No  results found for this or any previous visit (from the past 24 hour(s)).  Assessment & Plan:  1) Low-risk pregnancy [id number] at [redacted]w[redacted]d with an Estimated Date of Delivery: 09/28/22   Previous C section x 3, deisres BTL   Meds: No orders of the defined types were placed in this encounter.  Labs/procedures today:   Plan:  Continue routine obstetrical care  Next visit: prefers in person    Reviewed: Preterm labor symptoms and general obstetric precautions including but not limited to vaginal bleeding, contractions, leaking of fluid and fetal movement were reviewed in detail with the patient.  All questions were answered. Has home bp cuff. Rx faxed to . Check bp weekly, let us know if >140/90.   Follow-up: Return in about 2 weeks (around 08/08/2022) for [place].  No orders of the defined types were placed in this encounter.   Florian Buff, MD 07/25/2022 10:26 AM

## 2022-08-08 ENCOUNTER — Ambulatory Visit (INDEPENDENT_AMBULATORY_CARE_PROVIDER_SITE_OTHER): Payer: BC Managed Care – PPO | Admitting: Advanced Practice Midwife

## 2022-08-08 ENCOUNTER — Encounter: Payer: Self-pay | Admitting: Advanced Practice Midwife

## 2022-08-08 VITALS — BP 111/76 | HR 78 | Wt 137.0 lb

## 2022-08-08 DIAGNOSIS — Z3A32 32 weeks gestation of pregnancy: Secondary | ICD-10-CM

## 2022-08-08 DIAGNOSIS — Z3480 Encounter for supervision of other normal pregnancy, unspecified trimester: Secondary | ICD-10-CM

## 2022-08-08 DIAGNOSIS — Z3483 Encounter for supervision of other normal pregnancy, third trimester: Secondary | ICD-10-CM

## 2022-08-08 NOTE — Progress Notes (Signed)
   LOW-RISK PREGNANCY VISIT Patient name: Madeline Nelson MRN [id number]  Date of birth: 01/07/1986 Chief Complaint:   Routine Prenatal Visit  History of Present Illness:   Madeline Nelson is a 37 y.o. [phone] female at 106w5d with an Estimated Date of Delivery: 09/28/22 being seen today for ongoing management of a low-risk pregnancy.  Today she reports no complaints. Contractions: Not present.  .  Movement: Present. denies leaking of fluid. Review of Systems:   Pertinent items are noted in HPI Denies abnormal vaginal discharge w/ itching/odor/irritation, headaches, visual changes, shortness of breath, chest pain, abdominal pain, severe nausea/vomiting, or problems with urination or bowel movements unless otherwise stated above. Pertinent History Reviewed:  Reviewed past medical,surgical, social, obstetrical and family history.  Reviewed problem list, medications and allergies. Physical Assessment:   Vitals:   08/08/22 1412  BP: 111/76  Pulse: 78  Weight: 137 lb (62.1 kg)  Body mass index is 23.52 kg/m.        Physical Examination:   General appearance: Well appearing, and in no distress  Mental status: Alert, oriented to person, place, and time  Skin: Warm & dry  Cardiovascular: Normal heart rate noted  Respiratory: Normal respiratory effort, no distress  Abdomen: Soft, gravid, nontender  Pelvic: Cervical exam deferred         Extremities: Edema: None  Fetal Status: Fetal Heart Rate (bpm): 148 Fundal Height: 33 cm Movement: Present    Chaperone:  N/A    No results found for this or any previous visit (from the past 24 hour(s)).  Assessment & Plan:  1) Low-risk pregnancy [id number] at [redacted]w[redacted]d with an Estimated Date of Delivery: 09/28/22   2) Hx CS X 3, plans repeat (wants 3/3), will schedule next time   Meds: No orders of the defined types were placed in this encounter.  Labs/procedures today:   Plan:  Continue routine obstetrical care  Next visit: prefers in person    Reviewed:  Term labor symptoms and general obstetric precautions including but not limited to vaginal bleeding, contractions, leaking of fluid and fetal movement were reviewed in detail with the patient.  All questions were answered. Has home bp cuff. Check bp weekly, let us know if >140/90.   Follow-up: Return in about 2 weeks (around 08/22/2022) for lrob W/md (WANTS TO SCHEDULE cs THAT DAY).  No future appointments.   No orders of the defined types were placed in this encounter.  Christin Fudge DNP, CNM 08/08/2022 2:37 PM

## 2022-08-22 ENCOUNTER — Ambulatory Visit (INDEPENDENT_AMBULATORY_CARE_PROVIDER_SITE_OTHER): Payer: BC Managed Care – PPO | Admitting: Obstetrics & Gynecology

## 2022-08-22 ENCOUNTER — Encounter: Payer: Self-pay | Admitting: Obstetrics & Gynecology

## 2022-08-22 VITALS — BP 123/87 | HR 86 | Wt 138.0 lb

## 2022-08-22 DIAGNOSIS — Z3483 Encounter for supervision of other normal pregnancy, third trimester: Secondary | ICD-10-CM

## 2022-08-22 DIAGNOSIS — Z3480 Encounter for supervision of other normal pregnancy, unspecified trimester: Secondary | ICD-10-CM

## 2022-08-22 DIAGNOSIS — Z98891 History of uterine scar from previous surgery: Secondary | ICD-10-CM

## 2022-08-22 DIAGNOSIS — Z3A34 34 weeks gestation of pregnancy: Secondary | ICD-10-CM

## 2022-08-22 MED ORDER — OMEPRAZOLE 20 MG PO CPDR: 20.0000 mg | DELAYED_RELEASE_CAPSULE | Freq: Every day | ORAL | 6 refills | Status: DC

## 2022-08-22 NOTE — Progress Notes (Signed)
   LOW-RISK PREGNANCY VISIT Patient name: Madeline Nelson MRN [id number]  Date of birth: January 23, 1986 Chief Complaint:   Routine Prenatal Visit  History of Present Illness:   Madeline Nelson is a 37 y.o. [phone] female at [redacted]w[redacted]d with an Estimated Date of Delivery: 09/28/22 being seen today for ongoing management of a low-risk pregnancy.     03/14/2022    9:38 AM 01/08/2021   10:47 AM  Depression screen PHQ 2/9  Decreased Interest 1 0  Down, Depressed, Hopeless 1 0  PHQ - 2 Score 2 0  Altered sleeping 0 0  Tired, decreased energy 1 1  Change in appetite 0 0  Feeling bad or failure about yourself  0 0  Trouble concentrating 0 0  Moving slowly or fidgety/restless 0 0  Suicidal thoughts 0 0  PHQ-9 Score 3 1    Today she reports some palpatations today, emesis x 1 reflux. Contractions: Not present. Vag. Bleeding: None.  Movement: Present. denies leaking of fluid. Review of Systems:   Pertinent items are noted in HPI Denies abnormal vaginal discharge w/ itching/odor/irritation, headaches, visual changes, shortness of breath, chest pain, abdominal pain, severe nausea/vomiting, or problems with urination or bowel movements unless otherwise stated above. Pertinent History Reviewed:  Reviewed past medical,surgical, social, obstetrical and family history.  Reviewed problem list, medications and allergies. Physical Assessment:   Vitals:   08/22/22 1433 08/22/22 1437 08/22/22 1456  BP: (!) 133/96 126/88 123/87  Pulse: 94 91 86  Weight: 138 lb (62.6 kg)    Body mass index is 23.69 kg/m.        Physical Examination:   General appearance: Well appearing, and in no distress  Mental status: Alert, oriented to person, place, and time  Skin: Warm & dry  Cardiovascular: Normal heart rate noted  Respiratory: Normal respiratory effort, no distress  Abdomen: Soft, gravid, nontender  Pelvic: Cervical exam deferred         Extremities:    Fetal Status:     Movement: Present    Chaperone: n/a     No results found for this or any previous visit (from the past 24 hour(s)).  Assessment & Plan:  1) Low-risk pregnancy [id number] at [redacted]w[redacted]d with an Estimated Date of Delivery: 09/28/22   2) Borderline BP, BP 4 days,    Meds:  Meds ordered this encounter  Medications   omeprazole (PRILOSEC) 20 MG capsule    Sig: Take 1 capsule (20 mg total) by mouth daily. 1 tablet a day    Dispense:  30 capsule    Refill:  6   Labs/procedures today:   Plan:  Continue routine obstetrical care  Next visit: prefers in person    Reviewed: Preterm labor symptoms and general obstetric precautions including but not limited to vaginal bleeding, contractions, leaking of fluid and fetal movement were reviewed in detail with the patient.  All questions were answered. Has home bp cuff. Rx faxed to . Check bp weekly, let us know if >140/90.   Follow-up: No follow-ups on file.  No orders of the defined types were placed in this encounter.   Florian Buff, MD 08/22/2022 3:08 PM

## 2022-08-26 ENCOUNTER — Ambulatory Visit (INDEPENDENT_AMBULATORY_CARE_PROVIDER_SITE_OTHER): Payer: BC Managed Care – PPO | Admitting: Obstetrics & Gynecology

## 2022-08-26 VITALS — BP 125/83 | HR 88

## 2022-08-26 DIAGNOSIS — Z98891 History of uterine scar from previous surgery: Secondary | ICD-10-CM

## 2022-08-26 DIAGNOSIS — Z3480 Encounter for supervision of other normal pregnancy, unspecified trimester: Secondary | ICD-10-CM

## 2022-08-26 DIAGNOSIS — Z3A35 35 weeks gestation of pregnancy: Secondary | ICD-10-CM

## 2022-08-26 DIAGNOSIS — Z3483 Encounter for supervision of other normal pregnancy, third trimester: Secondary | ICD-10-CM

## 2022-08-26 NOTE — Progress Notes (Signed)
   LOW-RISK PREGNANCY VISIT Patient name: Madeline Nelson MRN [id number]  Date of birth: [birth year] Chief Complaint:   Routine Prenatal Visit (F/u bp)  History of Present Illness:   Madeline Nelson is a 37 y.o. [phone] female at [redacted]w[redacted]d with an Estimated Date of Delivery: 09/28/22 being seen today for ongoing management of a low-risk pregnancy.     03/14/2022    9:38 AM 01/08/2021   10:47 AM  Depression screen PHQ 2/9  Decreased Interest 1 0  Down, Depressed, Hopeless 1 0  PHQ - 2 Score 2 0  Altered sleeping 0 0  Tired, decreased energy 1 1  Change in appetite 0 0  Feeling bad or failure about yourself  0 0  Trouble concentrating 0 0  Moving slowly or fidgety/restless 0 0  Suicidal thoughts 0 0  PHQ-9 Score 3 1    Today she reports no complaints. Contractions: Not present. Vag. Bleeding: None.  Movement: Present. denies leaking of fluid. Review of Systems:   Pertinent items are noted in HPI Denies abnormal vaginal discharge w/ itching/odor/irritation, headaches, visual changes, shortness of breath, chest pain, abdominal pain, severe nausea/vomiting, or problems with urination or bowel movements unless otherwise stated above. Pertinent History Reviewed:  Reviewed past medical,surgical, social, obstetrical and family history.  Reviewed problem list, medications and allergies. Physical Assessment:   Vitals:   08/26/22 1030  BP: 125/83  Pulse: 88  There is no height or weight on file to calculate BMI.        Physical Examination:   General appearance: Well appearing, and in no distress  Mental status: Alert, oriented to person, place, and time  Skin: Warm & dry  Cardiovascular: Normal heart rate noted  Respiratory: Normal respiratory effort, no distress  Abdomen: Soft, gravid, nontender  Pelvic: Cervical exam deferred         Extremities:    Fetal Status:     Movement: Present    Chaperone: n/a    No results found for this or any previous visit (from the past 24 hour(s)).   Assessment & Plan:  1) Low-risk pregnancy G5P3013 at [redacted]w[redacted]d with an Estimated Date of Delivery: 09/28/22   2) Previous C section x 3, repeat 09/22/22@0930  LHE + BTL(any method),    Meds: No orders of the defined types were placed in this encounter.  Labs/procedures today:   Plan:  Continue routine obstetrical care  Next visit: prefers in person    Reviewed: Term labor symptoms and general obstetric precautions including but not limited to vaginal bleeding, contractions, leaking of fluid and fetal movement were reviewed in detail with the patient.  All questions were answered. Has home bp cuff. Rx faxed to . Check bp weekly, let us know if >140/90.   Follow-up: Return in about 10 days (around 09/05/2022) for [place].  No orders of the defined types were placed in this encounter.   Florian Buff, MD 08/26/2022 10:54 AM

## 2022-09-04 ENCOUNTER — Encounter: Payer: BC Managed Care – PPO | Admitting: Advanced Practice Midwife

## 2022-09-04 ENCOUNTER — Other Ambulatory Visit (HOSPITAL_COMMUNITY)
Admission: RE | Admit: 2022-09-04 | Discharge: 2022-09-04 | Disposition: A | Discharge status: Home / Self Care | Payer: BC Managed Care – PPO | Source: Ambulatory Visit | Attending: Advanced Practice Midwife | Admitting: Advanced Practice Midwife

## 2022-09-04 ENCOUNTER — Ambulatory Visit (INDEPENDENT_AMBULATORY_CARE_PROVIDER_SITE_OTHER): Payer: BC Managed Care – PPO | Admitting: Advanced Practice Midwife

## 2022-09-04 VITALS — BP 117/81 | HR 82 | Wt 142.0 lb

## 2022-09-04 DIAGNOSIS — Z3A36 36 weeks gestation of pregnancy: Secondary | ICD-10-CM

## 2022-09-04 DIAGNOSIS — Z3483 Encounter for supervision of other normal pregnancy, third trimester: Secondary | ICD-10-CM | POA: Insufficient documentation

## 2022-09-04 DIAGNOSIS — Z98891 History of uterine scar from previous surgery: Secondary | ICD-10-CM

## 2022-09-04 DIAGNOSIS — Z3480 Encounter for supervision of other normal pregnancy, unspecified trimester: Secondary | ICD-10-CM

## 2022-09-04 NOTE — Progress Notes (Signed)
   LOW-RISK PREGNANCY VISIT Patient name: Madeline Nelson MRN [id number]  Date of birth: 1985-10-06 Chief Complaint:   Routine Prenatal Visit  History of Present Illness:   Madeline Nelson is a 37 y.o. [phone] female at [redacted]w[redacted]d with an Estimated Date of Delivery: 09/28/22 being seen today for ongoing management of a low-risk pregnancy.  Today she reports no complaints. Contractions: Irritability. Vag. Bleeding: None.  Movement: Present. denies leaking of fluid. Review of Systems:   Pertinent items are noted in HPI Denies abnormal vaginal discharge w/ itching/odor/irritation, headaches, visual changes, shortness of breath, chest pain, abdominal pain, severe nausea/vomiting, or problems with urination or bowel movements unless otherwise stated above. Pertinent History Reviewed:  Reviewed past medical,surgical, social, obstetrical and family history.  Reviewed problem list, medications and allergies. Physical Assessment:   Vitals:   09/04/22 1018  BP: 117/81  Pulse: 82  Weight: 142 lb (64.4 kg)  Body mass index is 24.37 kg/m.        Physical Examination:   General appearance: Well appearing, and in no distress  Mental status: Alert, oriented to person, place, and time  Skin: Warm & dry  Cardiovascular: Normal heart rate noted  Respiratory: Normal respiratory effort, no distress  Abdomen: Soft, gravid, nontender  Pelvic: Cervical exam deferred         Extremities: Edema: Trace  Fetal Status: Fetal Heart Rate (bpm): 132 Fundal Height: 33 cm Movement: Present Presentation: Vertex  No results found for this or any previous visit (from the past 24 hour(s)).  Assessment & Plan:  1) Low-risk pregnancy G5P3013 at [redacted]w[redacted]d with an Estimated Date of Delivery: 09/28/22   2) Measuring S<D, previous babies between 5+13 and 7+2, says she usually measures small  3) RLCS and BTL on 09/22/22 w LHE   Meds: No orders of the defined types were placed in this encounter.  Labs/procedures today:  GBS/cultures  Plan:  Continue routine obstetrical care   Reviewed: Term labor symptoms and general obstetric precautions including but not limited to vaginal bleeding, contractions, leaking of fluid and fetal movement were reviewed in detail with the patient.  All questions were answered. Has home bp cuff. Check bp weekly, let us know if >140/90.   Follow-up: Return in about 1 week (around 09/11/2022) for LROB; weekly x 2.  Orders Placed This Encounter  Procedures   Culture, beta strep (group b only)   Myrtis Ser [facility] 09/04/2022 10:45 AM

## 2022-09-05 LAB — CERVICOVAGINAL ANCILLARY ONLY
Chlamydia: NEGATIVE
Comment: NEGATIVE
Comment: NORMAL
Neisseria Gonorrhea: NEGATIVE

## 2022-09-06 ENCOUNTER — Encounter: Payer: Self-pay | Admitting: Advanced Practice Midwife

## 2022-09-08 LAB — CULTURE, BETA STREP (GROUP B ONLY): Strep Gp B Culture: NEGATIVE

## 2022-09-09 NOTE — Patient Instructions (Signed)
Madeline Nelson  09/09/2022   Your procedure is scheduled on:  09/22/2022  Arrive at 76 at Entrance C on [address] at [facility]  and [employer] [employer] [address]. You are invited to use the FREE valet parking or use the Visitor's parking deck.  Pick up the phone at the desk and dial [phone].  Call this number if you have problems the morning of surgery: [phone]  Remember:   Do not eat food:(After Midnight) Desps de medianoche.  Do not drink clear liquids: (After Midnight) Desps de medianoche.  Take these medicines the morning of surgery with A SIP OF WATER:  none   Do not wear jewelry, make-up or nail polish.  Do not wear lotions, powders, or perfumes. Do not wear deodorant.  Do not shave 48 hours prior to surgery.  Do not bring valuables to the hospital.  [facility] is not   responsible for any belongings or valuables brought to the hospital.  Contacts, dentures or bridgework may not be worn into surgery.  Leave suitcase in the car. After surgery it may be brought to your room.  For patients admitted to the hospital, checkout time is 11:00 AM the day of              discharge.      Please read over the following fact sheets that you were given:     Preparing for Surgery

## 2022-09-10 ENCOUNTER — Other Ambulatory Visit: Payer: Self-pay | Admitting: Medical

## 2022-09-10 ENCOUNTER — Encounter (HOSPITAL_COMMUNITY): Payer: Self-pay

## 2022-09-10 DIAGNOSIS — F419 Anxiety disorder, unspecified: Secondary | ICD-10-CM

## 2022-09-11 ENCOUNTER — Encounter: Payer: Self-pay | Admitting: Advanced Practice Midwife

## 2022-09-11 ENCOUNTER — Ambulatory Visit (INDEPENDENT_AMBULATORY_CARE_PROVIDER_SITE_OTHER): Payer: BC Managed Care – PPO | Admitting: Advanced Practice Midwife

## 2022-09-11 VITALS — BP 120/81 | HR 77 | Wt 143.0 lb

## 2022-09-11 DIAGNOSIS — Z3483 Encounter for supervision of other normal pregnancy, third trimester: Secondary | ICD-10-CM

## 2022-09-11 DIAGNOSIS — Z3A37 37 weeks gestation of pregnancy: Secondary | ICD-10-CM

## 2022-09-11 MED ORDER — SERTRALINE HCL 50 MG PO TABS: 50.0000 mg | ORAL_TABLET | Freq: Every day | ORAL | 6 refills | Status: DC

## 2022-09-11 NOTE — Progress Notes (Addendum)
   LOW-RISK PREGNANCY VISIT Patient name: Madeline Nelson MRN [id number]  Date of birth: [birth year] Chief Complaint:   Routine Prenatal Visit  History of Present Illness:   Madeline Nelson is a 37 y.o. [phone] female at 67w4dwith an Estimated Date of Delivery: 09/28/22 being seen today for ongoing management of a low-risk pregnancy.  Today she reports no complaints. Contractions: Irritability. Vag. Bleeding: None.  Movement: Present. denies leaking of fluid. Review of Systems:   Pertinent items are noted in HPI Denies abnormal vaginal discharge w/ itching/odor/irritation, headaches, visual changes, shortness of breath, chest pain, abdominal pain, severe nausea/vomiting, or problems with urination or bowel movements unless otherwise stated above. Pertinent History Reviewed:  Reviewed past medical,surgical, social, obstetrical and family history.  Reviewed problem list, medications and allergies. Physical Assessment:   Vitals:   09/11/22 0916 09/11/22 0918  BP: 133/89 120/81  Pulse: 81 77  Weight: 143 lb (64.9 kg)   Body mass index is 24.55 kg/m.        Physical Examination:   General appearance: Well appearing, and in no distress  Mental status: Alert, oriented to person, place, and time  Skin: Warm & dry  Cardiovascular: Normal heart rate noted  Respiratory: Normal respiratory effort, no distress  Abdomen: Soft, gravid, nontender  Pelvic: Cervical exam deferred         Extremities: Edema: None  Fetal Status: Fetal Heart Rate (bpm): 137 Fundal Height: 34 cm Movement: Present    No results found for this or any previous visit (from the past 24 hour(s)).  Assessment & Plan:  1) Low-risk pregnancy [id number] 343w4dith an Estimated Date of Delivery: 09/28/22   2) First BP borderline, reviewed s/s pre-e; has nl values at home  3) Anxiety, Zoloft 5092mefilled   Meds:  Meds ordered this encounter  Medications   sertraline (ZOLOFT) 50 MG tablet    Sig: Take 1 tablet (50 mg total)  by mouth daily.    Dispense:  30 tablet    Refill:  6    Order Specific Question:   Supervising Provider    Answer:   OZAJanyth [id number]/procedures today: none (given pre-op soap)  Plan:  Continue routine obstetrical care   Reviewed: Term labor symptoms and general obstetric precautions including but not limited to vaginal bleeding, contractions, leaking of fluid and fetal movement were reviewed in detail with the patient.  All questions were answered. Has home bp cuff. Check bp weekly, let us [place] if >140/90.   Follow-up: Return for As scheduled.  No orders of the defined types were placed in this encounter.  KimMyrtis SerM 09/11/2022 9:36 AM

## 2022-09-11 NOTE — Addendum Note (Signed)
Addended by: Serita Grammes D on: 09/11/2022 09:36 AM   Modules accepted: Orders

## 2022-09-18 ENCOUNTER — Ambulatory Visit (INDEPENDENT_AMBULATORY_CARE_PROVIDER_SITE_OTHER): Payer: BC Managed Care – PPO | Admitting: Advanced Practice Midwife

## 2022-09-18 ENCOUNTER — Encounter: Payer: Self-pay | Admitting: Advanced Practice Midwife

## 2022-09-18 VITALS — BP 128/86 | HR 93

## 2022-09-18 DIAGNOSIS — F419 Anxiety disorder, unspecified: Secondary | ICD-10-CM

## 2022-09-18 DIAGNOSIS — Z3A38 38 weeks gestation of pregnancy: Secondary | ICD-10-CM

## 2022-09-18 DIAGNOSIS — Z3483 Encounter for supervision of other normal pregnancy, third trimester: Secondary | ICD-10-CM

## 2022-09-18 NOTE — Progress Notes (Signed)
   LOW-RISK PREGNANCY VISIT Patient name: Madeline Nelson MRN [id number]  Date of birth: 1985/08/02 Chief Complaint:   Routine Prenatal Visit  History of Present Illness:   Loreane Teats is a 37 y.o. [phone] female at 76w4dwith an Estimated Date of Delivery: 09/28/22 being seen today for ongoing management of a low-risk pregnancy.  Today she reports no complaints. Contractions: Irregular. Vag. Bleeding: None.  Movement: Present. denies leaking of fluid. Review of Systems:   Pertinent items are noted in HPI Denies abnormal vaginal discharge w/ itching/odor/irritation, headaches, visual changes, shortness of breath, chest pain, abdominal pain, severe nausea/vomiting, or problems with urination or bowel movements unless otherwise stated above. Pertinent History Reviewed:  Reviewed past medical,surgical, social, obstetrical and family history.  Reviewed problem list, medications and allergies. Physical Assessment:   Vitals:   09/18/22 0853  BP: 128/86  Pulse: 93  There is no height or weight on file to calculate BMI.        Physical Examination:   General appearance: Well appearing, and in no distress  Mental status: Alert, oriented to person, place, and time  Skin: Warm & dry  Cardiovascular: Normal heart rate noted  Respiratory: Normal respiratory effort, no distress  Abdomen: Soft, gravid, nontender  Pelvic: Cervical exam deferred         Extremities: Edema: None  Fetal Status: Fetal Heart Rate (bpm): 143   Movement: Present    No results found for this or any previous visit (from the past 24 hour(s)).  Assessment & Plan:  1) Low-risk pregnancy [id number] 310w4dith an Estimated Date of Delivery: 09/28/22   2) For rLTCS/BTL on 09/22/22, will have PP visits scheduled   Meds: No orders of the defined types were placed in this encounter.  Labs/procedures today: none  Plan:  Continue routine obstetrical care   Reviewed: Term labor symptoms and general obstetric precautions  including but not limited to vaginal bleeding, contractions, leaking of fluid and fetal movement were reviewed in detail with the patient.  All questions were answered. Has home bp cuff. Check bp weekly, let usKoreanow if >140/90.   Follow-up: Return for incision check ~ 3/10; PP visit in 7wks.  No orders of the defined types were placed in this encounter.  KiMyrtis SerNM 09/18/2022 9:08 AM

## 2022-09-20 ENCOUNTER — Encounter (HOSPITAL_COMMUNITY)
Admission: RE | Admit: 2022-09-20 | Discharge: 2022-09-20 | Disposition: A | Discharge status: Home / Self Care | Payer: BC Managed Care – PPO | Source: Ambulatory Visit | Attending: Obstetrics & Gynecology | Admitting: Obstetrics & Gynecology

## 2022-09-20 NOTE — Anesthesia Preprocedure Evaluation (Signed)
Anesthesia Evaluation  Patient identified by MRN, date of birth, ID band Patient awake    Reviewed: Allergy & Precautions, NPO status , Patient's Chart, lab work & pertinent test results  History of Anesthesia Complications Negative for: history of anesthetic complications  Airway Mallampati: II  TM Distance: >3 FB Neck ROM: Full    Dental  (+)    Pulmonary neg shortness of breath, neg sleep apnea, neg COPD, neg recent URI, former smoker   Pulmonary exam normal breath sounds clear to auscultation       Cardiovascular (-) hypertension(-) angina (-) CAD, (-) Past MI and (-) Cardiac Stents + dysrhythmias (WPW s/p ablation)  Rhythm:Regular Rate:Normal     Neuro/Psych  PSYCHIATRIC DISORDERS (OCD, panic attacks) Anxiety     negative neurological ROS     GI/Hepatic Neg liver ROS,GERD  Medicated,,  Endo/Other  negative endocrine ROS    Renal/GU negative Renal ROS     Musculoskeletal scoliosis   Abdominal   Peds  Hematology negative hematology ROS (+)   Anesthesia Other Findings 37 y.o. [id number] with h/o 3 previous c-sections presents for repeat c-section. Last c-section was 5 years ago.  Reproductive/Obstetrics                             Anesthesia Physical Anesthesia Plan  ASA: 2  Anesthesia Plan: Spinal   Post-op Pain Management: Ofirmev IV (intra-op)*   Induction:   PONV Risk Score and Plan: 2 and Ondansetron, Dexamethasone and Treatment may vary due to age or medical condition  Airway Management Planned:   Additional Equipment:   Intra-op Plan:   Post-operative Plan:   Informed Consent: I have reviewed the patients History and Physical, chart, labs and discussed the procedure including the risks, benefits and alternatives for the proposed anesthesia with the patient or authorized representative who has indicated his/her understanding and acceptance.     Dental advisory  given  Plan Discussed with: CRNA and Anesthesiologist  Anesthesia Plan Comments: (I have discussed risks of neuraxial anesthesia including but not limited to infection, bleeding, nerve injury, back pain, headache, seizures, and failure of block. Patient denies bleeding disorders and is not currently anticoagulated. Labs have been reviewed. Risks and benefits discussed. All patient's questions answered.  )        Anesthesia Quick Evaluation

## 2022-09-22 ENCOUNTER — Inpatient Hospital Stay (HOSPITAL_COMMUNITY): Payer: BC Managed Care – PPO | Admitting: Anesthesiology

## 2022-09-22 ENCOUNTER — Encounter (HOSPITAL_COMMUNITY)
Admission: AD | Disposition: A | Discharge status: Home / Self Care | Payer: Self-pay | Source: Home / Self Care | Attending: Obstetrics & Gynecology

## 2022-09-22 ENCOUNTER — Other Ambulatory Visit: Payer: Self-pay

## 2022-09-22 ENCOUNTER — Inpatient Hospital Stay (HOSPITAL_COMMUNITY)
Admission: AD | Admit: 2022-09-22 | Discharge: 2022-09-24 | DRG: 785 | Disposition: A | Discharge status: Home / Self Care | Payer: BC Managed Care – PPO | Attending: Obstetrics & Gynecology | Admitting: Obstetrics & Gynecology

## 2022-09-22 ENCOUNTER — Encounter (HOSPITAL_COMMUNITY): Payer: Self-pay | Admitting: Obstetrics & Gynecology

## 2022-09-22 DIAGNOSIS — O26893 Other specified pregnancy related conditions, third trimester: Secondary | ICD-10-CM | POA: Diagnosis present

## 2022-09-22 DIAGNOSIS — O34211 Maternal care for low transverse scar from previous cesarean delivery: Principal | ICD-10-CM | POA: Diagnosis present

## 2022-09-22 DIAGNOSIS — O9902 Anemia complicating childbirth: Secondary | ICD-10-CM | POA: Diagnosis present

## 2022-09-22 DIAGNOSIS — Z3490 Encounter for supervision of normal pregnancy, unspecified, unspecified trimester: Secondary | ICD-10-CM

## 2022-09-22 DIAGNOSIS — Z302 Encounter for sterilization: Secondary | ICD-10-CM

## 2022-09-22 DIAGNOSIS — Z3A39 39 weeks gestation of pregnancy: Secondary | ICD-10-CM

## 2022-09-22 DIAGNOSIS — Z98891 History of uterine scar from previous surgery: Secondary | ICD-10-CM

## 2022-09-22 DIAGNOSIS — R030 Elevated blood-pressure reading, without diagnosis of hypertension: Secondary | ICD-10-CM | POA: Diagnosis present

## 2022-09-22 DIAGNOSIS — Z3480 Encounter for supervision of other normal pregnancy, unspecified trimester: Principal | ICD-10-CM

## 2022-09-22 LAB — COMPREHENSIVE METABOLIC PANEL
ALT: 13 U/L (ref 0–44)
AST: 37 U/L (ref 15–41)
Albumin: 2.5 g/dL — ABNORMAL LOW (ref 3.5–5.0)
Alkaline Phosphatase: 119 U/L (ref 38–126)
Anion gap: 7 (ref 5–15)
BUN: 7 mg/dL (ref 6–20)
CO2: 22 mmol/L (ref 22–32)
Calcium: 8.4 mg/dL — ABNORMAL LOW (ref 8.9–10.3)
Chloride: 105 mmol/L (ref 98–111)
Creatinine, Ser: 0.72 mg/dL (ref 0.44–1.00)
GFR, Estimated: >60 mL/min (ref >60)
Glucose, Bld: 163 mg/dL — ABNORMAL HIGH (ref 70–99)
Potassium: 3.8 mmol/L (ref 3.5–5.1)
Sodium: 134 mmol/L — ABNORMAL LOW (ref 135–145)
Total Bilirubin: 0.7 mg/dL (ref 0.3–1.2)
Total Protein: 5.3 g/dL — ABNORMAL LOW (ref 6.5–8.1)

## 2022-09-22 LAB — CBC
HCT: 34.6 % — ABNORMAL LOW (ref 36.0–46.0)
Hemoglobin: 11.8 g/dL — ABNORMAL LOW (ref 12.0–15.0)
MCH: 28.4 pg (ref 26.0–34.0)
MCHC: 34.1 g/dL (ref 30.0–36.0)
MCV: 83.4 fL (ref 80.0–100.0)
Platelets: 298 10*3/uL (ref 150–400)
RBC: 4.15 MIL/uL (ref 3.87–5.11)
RDW: 12.9 % (ref 11.5–15.5)
WBC: 10.8 10*3/uL — ABNORMAL HIGH (ref 4.0–10.5)
nRBC: 0 % (ref 0.0–0.2)

## 2022-09-22 LAB — TYPE AND SCREEN
ABO/RH(D): O POS
Antibody Screen: NEGATIVE

## 2022-09-22 LAB — RPR: RPR Ser Ql: NONREACTIVE

## 2022-09-22 SURGERY — Surgical Case
Anesthesia: Spinal

## 2022-09-22 MED ORDER — COCONUT OIL OIL: 1.0000 | TOPICAL_OIL | Status: DC | PRN

## 2022-09-22 MED ORDER — ONDANSETRON HCL 4 MG/2ML IJ SOLN
INTRAMUSCULAR | Status: AC
  Filled 2022-09-22: qty 2

## 2022-09-22 MED ORDER — FENTANYL CITRATE (PF) 100 MCG/2ML IJ SOLN
INTRAMUSCULAR | Status: DC | PRN
  Administered 2022-09-22: 15 ug via INTRATHECAL

## 2022-09-22 MED ORDER — LACTATED RINGERS IV SOLN: INTRAVENOUS | Status: DC

## 2022-09-22 MED ORDER — STERILE WATER FOR IRRIGATION IR SOLN
Status: DC | PRN
  Administered 2022-09-22: 1

## 2022-09-22 MED ORDER — KETOROLAC TROMETHAMINE 30 MG/ML IJ SOLN
30.0000 mg | Freq: Once | INTRAMUSCULAR | Status: AC | PRN
  Administered 2022-09-22: 30 mg via INTRAVENOUS

## 2022-09-22 MED ORDER — ENOXAPARIN SODIUM 40 MG/0.4ML IJ SOSY
40.0000 mg | PREFILLED_SYRINGE | INTRAMUSCULAR | Status: DC
  Administered 2022-09-23 – 2022-09-24 (×2): 40 mg via SUBCUTANEOUS
  Filled 2022-09-22 (×2): qty 0.4

## 2022-09-22 MED ORDER — BUPIVACAINE LIPOSOME 1.3 % IJ SUSP
INTRAMUSCULAR | Status: AC
  Filled 2022-09-22: qty 20

## 2022-09-22 MED ORDER — BUPIVACAINE LIPOSOME 1.3 % IJ SUSP
INTRAMUSCULAR | Status: DC | PRN
  Administered 2022-09-22: 20 mL

## 2022-09-22 MED ORDER — CEFAZOLIN SODIUM-DEXTROSE 2-4 GM/100ML-% IV SOLN
INTRAVENOUS | Status: AC
  Filled 2022-09-22: qty 100

## 2022-09-22 MED ORDER — ZOLPIDEM TARTRATE 5 MG PO TABS: 5.0000 mg | ORAL_TABLET | Freq: Every evening | ORAL | Status: DC | PRN

## 2022-09-22 MED ORDER — KETOROLAC TROMETHAMINE 30 MG/ML IJ SOLN
30.0000 mg | Freq: Four times a day (QID) | INTRAMUSCULAR | Status: AC
  Administered 2022-09-22 – 2022-09-23 (×4): 30 mg via INTRAVENOUS
  Filled 2022-09-22 (×4): qty 1

## 2022-09-22 MED ORDER — SERTRALINE HCL 50 MG PO TABS
50.0000 mg | ORAL_TABLET | Freq: Every day | ORAL | Status: DC
  Administered 2022-09-23 – 2022-09-24 (×2): 50 mg via ORAL
  Filled 2022-09-22 (×2): qty 1

## 2022-09-22 MED ORDER — PRENATAL MULTIVITAMIN CH: 1.0000 | ORAL_TABLET | Freq: Every day | ORAL | Status: DC

## 2022-09-22 MED ORDER — SODIUM CHLORIDE (PF) 0.9 % IJ SOLN
INTRAMUSCULAR | Status: AC
  Filled 2022-09-22: qty 10

## 2022-09-22 MED ORDER — BUPIVACAINE IN DEXTROSE 0.75-8.25 % IT SOLN
INTRATHECAL | Status: DC | PRN
  Administered 2022-09-22: 1.6 mL via INTRATHECAL

## 2022-09-22 MED ORDER — ONDANSETRON HCL 4 MG/2ML IJ SOLN
INTRAMUSCULAR | Status: DC | PRN
  Administered 2022-09-22: 4 mg via INTRAVENOUS

## 2022-09-22 MED ORDER — SIMETHICONE 80 MG PO CHEW: 80.0000 mg | CHEWABLE_TABLET | ORAL | Status: DC | PRN

## 2022-09-22 MED ORDER — KETOROLAC TROMETHAMINE 30 MG/ML IJ SOLN
INTRAMUSCULAR | Status: AC
  Filled 2022-09-22: qty 1

## 2022-09-22 MED ORDER — FUROSEMIDE 20 MG PO TABS
20.0000 mg | ORAL_TABLET | Freq: Every day | ORAL | Status: DC
  Administered 2022-09-22 – 2022-09-24 (×3): 20 mg via ORAL
  Filled 2022-09-22 (×3): qty 1

## 2022-09-22 MED ORDER — FENTANYL CITRATE (PF) 100 MCG/2ML IJ SOLN
INTRAMUSCULAR | Status: AC
  Filled 2022-09-22: qty 2

## 2022-09-22 MED ORDER — DIPHENHYDRAMINE HCL 25 MG PO CAPS: 25.0000 mg | ORAL_CAPSULE | Freq: Four times a day (QID) | ORAL | Status: DC | PRN

## 2022-09-22 MED ORDER — OXYTOCIN-SODIUM CHLORIDE 30-0.9 UT/500ML-% IV SOLN
INTRAVENOUS | Status: AC
  Filled 2022-09-22: qty 500

## 2022-09-22 MED ORDER — DEXAMETHASONE SODIUM PHOSPHATE 10 MG/ML IJ SOLN
INTRAMUSCULAR | Status: AC
  Filled 2022-09-22: qty 1

## 2022-09-22 MED ORDER — PHENYLEPHRINE HCL-NACL 20-0.9 MG/250ML-% IV SOLN
INTRAVENOUS | Status: AC
  Filled 2022-09-22: qty 250

## 2022-09-22 MED ORDER — CEFAZOLIN SODIUM-DEXTROSE 2-4 GM/100ML-% IV SOLN
2.0000 g | INTRAVENOUS | Status: AC
  Administered 2022-09-22: 2 g via INTRAVENOUS

## 2022-09-22 MED ORDER — OXYTOCIN-SODIUM CHLORIDE 30-0.9 UT/500ML-% IV SOLN
INTRAVENOUS | Status: DC | PRN
  Administered 2022-09-22: 30 [IU] via INTRAVENOUS

## 2022-09-22 MED ORDER — DIBUCAINE (PERIANAL) 1 % EX OINT: 1.0000 | TOPICAL_OINTMENT | CUTANEOUS | Status: DC | PRN

## 2022-09-22 MED ORDER — MORPHINE SULFATE (PF) 0.5 MG/ML IJ SOLN
INTRAMUSCULAR | Status: AC
  Filled 2022-09-22: qty 10

## 2022-09-22 MED ORDER — FENTANYL CITRATE (PF) 100 MCG/2ML IJ SOLN: 25.0000 ug | INTRAMUSCULAR | Status: DC | PRN

## 2022-09-22 MED ORDER — PROMETHAZINE HCL 25 MG/ML IJ SOLN
INTRAMUSCULAR | Status: DC | PRN
  Administered 2022-09-22: 6.25 mg via INTRAVENOUS

## 2022-09-22 MED ORDER — OXYTOCIN-SODIUM CHLORIDE 30-0.9 UT/500ML-% IV SOLN: 2.5000 [IU]/h | INTRAVENOUS | Status: AC

## 2022-09-22 MED ORDER — ACETAMINOPHEN 160 MG/5ML PO SOLN
650.0000 mg | Freq: Four times a day (QID) | ORAL | Status: DC
  Administered 2022-09-22 – 2022-09-24 (×7): 650 mg via ORAL
  Filled 2022-09-22 (×7): qty 20.3

## 2022-09-22 MED ORDER — PROMETHAZINE HCL 25 MG/ML IJ SOLN: 6.2500 mg | INTRAMUSCULAR | Status: DC | PRN

## 2022-09-22 MED ORDER — MORPHINE SULFATE (PF) 0.5 MG/ML IJ SOLN
INTRAMUSCULAR | Status: DC | PRN
  Administered 2022-09-22: 150 ug via INTRATHECAL

## 2022-09-22 MED ORDER — GABAPENTIN 100 MG PO CAPS
200.0000 mg | ORAL_CAPSULE | Freq: Every day | ORAL | Status: DC
  Administered 2022-09-22 – 2022-09-23 (×2): 200 mg via ORAL
  Filled 2022-09-22 (×2): qty 2

## 2022-09-22 MED ORDER — COMPLETENATE 29-1 MG PO CHEW
1.0000 | CHEWABLE_TABLET | Freq: Every day | ORAL | Status: DC
  Administered 2022-09-22 – 2022-09-23 (×2): 1 via ORAL
  Filled 2022-09-22 (×2): qty 1

## 2022-09-22 MED ORDER — PHENYLEPHRINE HCL-NACL 20-0.9 MG/250ML-% IV SOLN
INTRAVENOUS | Status: DC | PRN
  Administered 2022-09-22: 60 ug/min via INTRAVENOUS

## 2022-09-22 MED ORDER — OXYCODONE HCL 5 MG/5ML PO SOLN: 5.0000 mg | Freq: Once | ORAL | Status: DC | PRN

## 2022-09-22 MED ORDER — SENNOSIDES-DOCUSATE SODIUM 8.6-50 MG PO TABS
2.0000 | ORAL_TABLET | Freq: Every day | ORAL | Status: DC
  Administered 2022-09-23 – 2022-09-24 (×2): 2 via ORAL
  Filled 2022-09-22 (×2): qty 2

## 2022-09-22 MED ORDER — OXYCODONE HCL 5 MG PO TABS
5.0000 mg | ORAL_TABLET | ORAL | Status: DC | PRN
  Administered 2022-09-23 – 2022-09-24 (×6): 10 mg via ORAL
  Filled 2022-09-22 (×6): qty 2

## 2022-09-22 MED ORDER — PROMETHAZINE HCL 25 MG/ML IJ SOLN
INTRAMUSCULAR | Status: AC
  Filled 2022-09-22: qty 1

## 2022-09-22 MED ORDER — ACETAMINOPHEN 500 MG PO TABS: 1000.0000 mg | ORAL_TABLET | Freq: Four times a day (QID) | ORAL | Status: DC

## 2022-09-22 MED ORDER — IBUPROFEN 600 MG PO TABS
600.0000 mg | ORAL_TABLET | Freq: Four times a day (QID) | ORAL | Status: DC
  Administered 2022-09-23 – 2022-09-24 (×4): 600 mg via ORAL
  Filled 2022-09-22 (×4): qty 1

## 2022-09-22 MED ORDER — SODIUM CHLORIDE 0.9 % IR SOLN
Status: DC | PRN
  Administered 2022-09-22: 1

## 2022-09-22 MED ORDER — TETANUS-DIPHTH-ACELL PERTUSSIS 5-2.5-18.5 LF-MCG/0.5 IM SUSY: 0.5000 mL | PREFILLED_SYRINGE | Freq: Once | INTRAMUSCULAR | Status: DC

## 2022-09-22 MED ORDER — PHENYLEPHRINE 80 MCG/ML (10ML) SYRINGE FOR IV PUSH (FOR BLOOD PRESSURE SUPPORT)
PREFILLED_SYRINGE | INTRAVENOUS | Status: DC | PRN
  Administered 2022-09-22: 160 ug via INTRAVENOUS

## 2022-09-22 MED ORDER — OXYCODONE HCL 5 MG PO TABS: 5.0000 mg | ORAL_TABLET | Freq: Once | ORAL | Status: DC | PRN

## 2022-09-22 MED ORDER — WITCH HAZEL-GLYCERIN EX PADS: 1.0000 | MEDICATED_PAD | CUTANEOUS | Status: DC | PRN

## 2022-09-22 MED ORDER — DEXAMETHASONE SODIUM PHOSPHATE 10 MG/ML IJ SOLN
INTRAMUSCULAR | Status: DC | PRN
  Administered 2022-09-22: 10 mg via INTRAVENOUS

## 2022-09-22 MED ORDER — POVIDONE-IODINE 10 % EX SWAB: 2.0000 | Freq: Once | CUTANEOUS | Status: DC

## 2022-09-22 MED ORDER — SIMETHICONE 80 MG PO CHEW
80.0000 mg | CHEWABLE_TABLET | Freq: Three times a day (TID) | ORAL | Status: DC
  Administered 2022-09-22 – 2022-09-24 (×6): 80 mg via ORAL
  Filled 2022-09-22 (×6): qty 1

## 2022-09-22 MED ORDER — MENTHOL 3 MG MT LOZG: 1.0000 | LOZENGE | OROMUCOSAL | Status: DC | PRN

## 2022-09-22 SURGICAL SUPPLY — 33 items
CHLORAPREP W/TINT 26 (MISCELLANEOUS) ×1 IMPLANT
CLAMP UMBILICAL CORD (MISCELLANEOUS) ×1 IMPLANT
CLIP FILSHIE TUBAL LIGA STRL (Clip) IMPLANT
CLOTH BEACON ORANGE TIMEOUT ST (SAFETY) ×1 IMPLANT
DERMABOND ADVANCED .7 DNX12 (GAUZE/BANDAGES/DRESSINGS) ×2 IMPLANT
DRSG OPSITE POSTOP 4X10 (GAUZE/BANDAGES/DRESSINGS) ×1 IMPLANT
ELECT REM PT RETURN 9FT ADLT (ELECTROSURGICAL) ×1
ELECTRODE REM PT RTRN 9FT ADLT (ELECTROSURGICAL) ×1 IMPLANT
EXTRACTOR VACUUM BELL STYLE (SUCTIONS) IMPLANT
GLOVE BIOGEL PI IND STRL 7.0 (GLOVE) ×1 IMPLANT
GLOVE BIOGEL PI IND STRL 8 (GLOVE) ×1 IMPLANT
GLOVE ECLIPSE 8.0 STRL XLNG CF (GLOVE) ×1 IMPLANT
GOWN STRL REUS W/TWL LRG LVL3 (GOWN DISPOSABLE) ×2 IMPLANT
KIT ABG SYR 3ML LUER SLIP (SYRINGE) ×1 IMPLANT
NDL HYPO 25X5/8 SAFETYGLIDE (NEEDLE) ×1 IMPLANT
NEEDLE HYPO 25X5/8 SAFETYGLIDE (NEEDLE) ×1 IMPLANT
NS IRRIG 1000ML POUR BTL (IV SOLUTION) ×1 IMPLANT
PACK C SECTION WH (CUSTOM PROCEDURE TRAY) ×1 IMPLANT
PAD OB MATERNITY 4.3X12.25 (PERSONAL CARE ITEMS) ×1 IMPLANT
RTRCTR C-SECT PINK 25CM LRG (MISCELLANEOUS) IMPLANT
SUT CHROMIC 0 CT 1 (SUTURE) ×1 IMPLANT
SUT MNCRL 0 VIOLET CTX 36 (SUTURE) ×2 IMPLANT
SUT MONOCRYL 0 CTX 36 (SUTURE) ×2
SUT PLAIN 2 0 (SUTURE)
SUT PLAIN 2 0 XLH (SUTURE) IMPLANT
SUT PLAIN ABS 2-0 CT1 27XMFL (SUTURE) IMPLANT
SUT VIC AB 0 CTX 36 (SUTURE) ×1
SUT VIC AB 0 CTX36XBRD ANBCTRL (SUTURE) ×1 IMPLANT
SUT VIC AB 4-0 KS 27 (SUTURE) IMPLANT
SYR 20CC LL (SYRINGE) ×2 IMPLANT
TOWEL OR 17X24 6PK STRL BLUE (TOWEL DISPOSABLE) ×1 IMPLANT
TRAY FOLEY W/BAG SLVR 14FR LF (SET/KITS/TRAYS/PACK) IMPLANT
WATER STERILE IRR 1000ML POUR (IV SOLUTION) ×1 IMPLANT

## 2022-09-22 NOTE — Op Note (Signed)
Cesarean Section Procedure Note   Madeline Nelson  09/22/2022  Indications:  Previous Cesarean section X3, undesired fertility  Pre-operative Diagnosis: previous cesarean section x 3 with bilateral tubal.   Post-operative Diagnosis: Same   Surgeon: Surgeon(s) and Role:    * Eure, Mertie Clause, MD - Primary    * Maryetta Shafer, Lyndel Safe, MD - Fellow   Attending Attestation: I was present and scrubbed for the entire procedure.   Assistants: Liliane Channel MD  An experienced assistant was required given the standard of surgical care given the complexity of the case.  This assistant was needed for exposure, dissection, suctioning, retraction, instrument exchange, assisting with delivery with administration of fundal pressure, and for overall help during the procedure.  Anesthesia: spinal    Estimated Blood Loss: 496 ml  Total IV Fluids: 2529m   Urine Output:  150CC OF clear urine  Specimens: Placenta  Findings:  Baby condition / location:  Couplet care / Skin to Skin, viable female baby, 2960 gm APGAR: 8, 9; weight  .     Complications: no complications  Indications: Madeline Milleris a 37y.o. [id number] an IUP 37w1dresenting for scheduled repeat Cesarean.  The risks, benefits, complications, treatment options, and expected outcomes were discussed with the patient . The patient concurred with the proposed plan, giving informed consent. identified as Madeline Nelson the procedure verified as C-Section Delivery.  Procedure Details: A Time Out was held and the above information confirmed.  The patient was taken back to the operative suite where spinal anesthesia was placed.  After induction of anesthesia, the patient was draped and prepped in the usual sterile manner and placed in a dorsal supine position with a leftward tilt. A transverse was made and carried down through the subcutaneous tissue to the fascia. Fascial incision was made and extended transversely. The fascia  was separated from the underlying rectus tissue superiorly and inferiorly. The peritoneum was identified and entered. Peritoneal incision was extended longitudinally. A low transverse uterine incision was made. Delivered from cephalic presentation was a 1960 gram Living newborn infant(s) or Female with Apgar scores of 8 at one minute and 9 at five minutes. Cord ph was sent the umbilical cord was clamped and cut cord blood was obtained for evaluation. The placenta was removed Intact and appeared normal. The uterine incision was closed with running locked sutures of 0- Monocryl.   Hemostasis was observed.  The uterine outline appeared normal. Attention was turned to the ovaries and tubes. The entire left ovary was noted to be firmly adherent to the left fallopian tube, and there were large engorged blood vessels within the mesosalpinx, with minimal avascular window noted. Due to this, a salpingectomy was deemed to be unsafe at this time. Patient and spouse were informed about this and a decision was made to use filshie clips for BTL. Filshie clip was placed on the left fallopian tube, about 1.5cm to the cornua, in the only available avascular window. The right fallopian tube was mildly adherent to the ovary, but allowed for a large avascular window, hence a filshie clip was placed in the usual position, about 2.5cm from the uterine cornua. The muscle layer and the underlying peritoneum were re-approximated with interrupted stitches, using 2-0 chromic. The fascia was then reapproximated with running sutures of 0Vicryl. The subcuticular closure was performed using 2-0plain gut. The skin was closed with 4-0Vicryl in a subcuticular fashion.   Instrument, sponge, and needle counts were correct prior the abdominal closure and  were correct at the conclusion of the case.     Disposition: PACU - hemodynamically stable.   Maternal Condition: stable       Signed: Liliane Channel MD MPH OB Fellow, [place] for [place] 09/22/2022  09/22/2022 11:07 AM

## 2022-09-22 NOTE — Anesthesia Postprocedure Evaluation (Signed)
Anesthesia Post Note  Patient: Madeline Nelson  Procedure(s) Performed: REPEAT CESAREAN SECTION WITH BILATERAL TUBAL LIGATION     Patient location during evaluation: PACU Anesthesia Type: Spinal Level of consciousness: awake Pain management: pain level controlled Vital Signs Assessment: post-procedure vital signs reviewed and stable Respiratory status: spontaneous breathing, respiratory function stable and nonlabored ventilation Cardiovascular status: blood pressure returned to baseline and stable Postop Assessment: no headache, no backache and no apparent nausea or vomiting Anesthetic complications: no   No notable events documented.  Last Vitals:  Vitals:   09/22/22 1129 09/22/22 1145  BP: (!) 122/98 (!) 130/92  Pulse: 67 74  Resp: 17 18  Temp:  36.5 C  SpO2: 100% 100%    Last Pain:  Vitals:   09/22/22 1145  PainSc: 0-No pain   Pain Goal:    LLE Motor Response: Purposeful movement (09/22/22 1130)   RLE Motor Response: Purposeful movement (09/22/22 1130)       Epidural/Spinal Function Cutaneous sensation: Normal sensation (09/22/22 1145), Patient able to flex knees: Yes (09/22/22 1145), Patient able to lift hips off bed: Yes (09/22/22 1145), Back pain beyond tenderness at insertion site: No (09/22/22 1145), Progressively worsening motor and/or sensory loss: No (09/22/22 1145), Bowel and/or bladder incontinence post epidural: No (09/22/22 1145)  Nilda Simmer

## 2022-09-22 NOTE — Anesthesia Procedure Notes (Signed)
Spinal  Patient location during procedure: OR Start time: 09/22/2022 8:53 AM End time: 09/22/2022 8:54 AM Reason for block: surgical anesthesia Staffing Performed: anesthesiologist  Anesthesiologist: Nilda Simmer, MD Performed by: Nilda Simmer, MD Authorized by: Nilda Simmer, MD   Preanesthetic Checklist Completed: patient identified, IV checked, site marked, risks and benefits discussed, surgical consent, monitors and equipment checked, pre-op evaluation and timeout performed Spinal Block Patient position: sitting Prep: DuraPrep Patient monitoring: blood pressure and continuous pulse ox Approach: midline Location: L3-4 Injection technique: single-shot Needle Needle type: Pencan  Needle gauge: 24 G Needle length: 9 cm Additional Notes Risks and benefits of neuraxial anesthesia including, but not limited to, infection, bleeding, local anesthetic toxicity, headache, hypotension, back pain, block failure, etc. were discussed with the patient. The patient expressed understanding and consented to the procedure. I confirmed that the patient has no bleeding disorders and is not taking blood thinners. I confirmed the patient's last platelet count with the nurse. Monitors were applied. A time-out was performed immediately prior to the procedure. Sterile technique was used throughout the whole procedure.   __1_ attempt(s)

## 2022-09-22 NOTE — Lactation Note (Signed)
This note was copied from a baby's chart. Lactation Consultation Note  Patient Name: Madeline Nelson [id number] Date: 09/22/2022 Age:37 hours  Reason for consult: Initial assessment;Term  P4, 39.1 GA  This is mother's fourth baby and she reports successfully breastfeeding. Infant currently latched and feeding well. Encouraged mother to bring baby closer to breast for comfort. She is feeding baby with cues, baby is skin to skin and father is attentive.  Mother provided a handout with O/P services, breastfeeding support groups, and our phone # for post-discharge questions.    Mother aware of lactation support and availability. Encouraged to request assist as needed. Will see mother upon her request.    Maternal Data Has patient been taught Hand Expression?: Yes Does the patient have breastfeeding experience prior to this delivery?: Yes How long did the patient breastfeed?: 6 months to 1.5 years  Feeding Mother's Current Feeding Choice: Breast Milk  LATCH Score Latch: Grasps breast easily, tongue down, lips flanged, rhythmical sucking. (encouraged to bring baby closer to breast)  Audible Swallowing: A few with stimulation  Type of Nipple: Everted at rest and after stimulation  Comfort (Breast/Nipple): Soft / non-tender  Hold (Positioning): No assistance needed to correctly position infant at breast.  LATCH Score: 9    Interventions Interventions: Education;LC Services brochure  Discharge Pump: Personal  Consult Status Consult Status: Follow-up Date: 09/23/22 Follow-up type: In-patient    Stana Bunting M 09/22/2022, 3:43 PM

## 2022-09-22 NOTE — Transfer of Care (Signed)
Immediate Anesthesia Transfer of Care Note  Patient: Madeline Nelson  Procedure(s) Performed: REPEAT CESAREAN SECTION WITH BILATERAL TUBAL LIGATION  Patient Location: PACU  Anesthesia Type:Spinal  Level of Consciousness: awake, alert , and oriented  Airway & Oxygen Therapy: Patient Spontanous Breathing  Post-op Assessment: Report given to RN and Post -op Vital signs reviewed and stable  Post vital signs: Reviewed and stable  Last Vitals:  Vitals Value Taken Time  BP 117/81 09/22/22 1030  Temp    Pulse 78 09/22/22 1030  Resp 23 09/22/22 1030  SpO2 100 % 09/22/22 1030  Vitals shown include unvalidated device data.  Last Pain:  Vitals:   09/22/22 0637  PainSc: 0-No pain         Complications: No notable events documented.

## 2022-09-22 NOTE — H&P (Signed)
LABOR AND DELIVERY ADMISSION HISTORY AND PHYSICAL NOTE  Madeline Nelson is a 37 y.o. female [id number] with IUP at 68w1dpresenting for scheduled repeat C-section.   Patient reports the fetal movement as active. Patient reports uterine contraction  activity as none. Patient reports  vaginal bleeding as none. Patient describes fluid per vagina as None.   Patient denies headache, vision changes, chest pain, shortness of breath, right upper quadrant pain, or LE edema.  She plans on breast feeding feeding. Her contraception plan is: bilateral tubal ligation.  Pren[facility] History/Complications[facility]  Sono:  '@[redacted]w[redacted]d'$ , CWD, normal an atomy, cephalic presentation, anterior placenta, 70%ile, EFW [id number] Pregnancy complications:  Patient Active Problem List   Diagnosis Date Noted   Intrauterine normal pregnancy 09/22/2022   Encounter for supervision of normal pregnancy, antepartum 03/14/2022   Previous cesarean section 01/08/2021   Anxiety w/ panic attacks 01/08/2021   Palpitations 12/22/2013   WPW (Wolff-Parkinson-White syndrome) 12/22/2013   OCD (obsessive compulsive disorder) 12/22/2013   Scoliosis 12/22/2013    Past Medical History: Past Medical History:  Diagnosis Date   Anxiety    OCD (obsessive compulsive disorder)    Wolff-Parkinson-White (WPW) syndrome     Past Surgical History: Past Surgical History:  Procedure Laterality Date   ATRIAL FIBRILLATION ABLATION     CESAREAN SECTION     toe nail removal Bilateral    WISDOM TOOTH EXTRACTION      Obstetrical History: OB History     Gravida  5   Para  3   Term  3   Preterm      AB  1   Living  3      SAB  1   IAB      Ectopic      Multiple      Live Births  3        Obstetric Comments  Scant amount of vaginal bleeding noted this date, hx of same with prior pregnancy         Social History: Social History   Socioeconomic History   Marital status: Married    Spouse name:  Not on file   Number of children: Not on file   Years of education: Not on file   Highest education level: Not on file  Occupational History   Not on file  Tobacco Use   Smoking status: Former    Types: Cigarettes   Smokeless tobacco: Never  Vaping Use   Vaping Use: Former  Substance and Sexual Activity   Alcohol use: No   Drug use: No   Sexual activity: Yes    Birth control/protection: None  Other Topics Concern   Not on file  Social History Narrative   Not on file   Social Determinants of Health   Financial Resource Strain: Low Risk  (03/14/2022)   Overall Financial Resource Strain (CARDIA)    Difficulty of Paying Living Expenses: Not hard at all  Food Insecurity: No Food Insecurity (09/22/2022)   Hunger Vital Sign    Worried About Running Out of Food in the Last Year: Never true    Ran Out of Food in the Last Year: Never true  Transportation Needs: No Transportation Needs (09/22/2022)   PRAPARE - THydrologist(Medical): No    Lack of Transportation (Non-Medical): No  Physical Activity: Sufficiently Active (03/14/2022)   Exercise Vital Sign    Days of Exercise per Week: 5 days  Minutes of Exercise per Session: 30 min  Stress: No Stress Concern Present (03/14/2022)   [place]    Feeling of Stress : Only a little  Social Connections: Socially Integrated (03/14/2022)   Social Connection and Isolation Panel [NHANES]    Frequency of Communication with Friends and Family: More than three times a week    Frequency of Social Gatherings with Friends and Family: Once a week    Attends Religious Services: More than 4 times per year    Active Member of [employer] or Organizations: Yes    Attends Archivist Meetings: 1 to 4 times per year    Marital Status: Married    Family History: Family History  Problem Relation Age of Onset   Alzheimer's disease Mother    Congestive Heart  Failure Father    Kidney failure Father    Other Daughter        microduplication [id number]   Autism Daughter    Other Daughter        microduplication [id number]    Allergies: Allergies  Allergen Reactions   Sudafed [Pseudoephedrine Hcl] Palpitations    Medications Prior to Admission  Medication Sig Dispense Refill Last Dose   omeprazole (PRILOSEC) 20 MG capsule Take 1 capsule (20 mg total) by mouth daily. 1 tablet a day 30 capsule 6 09/22/2022   Prenatal Vit-Fe Fumarate-FA (PRENATAL VITAMIN PO) Take 1 tablet by mouth daily.   09/21/2022   promethazine (PHENERGAN) 25 MG tablet Take 1 tablet (25 mg total) by mouth every 6 (six) hours as needed for nausea or vomiting. 30 tablet 1 09/21/2022   sertraline (ZOLOFT) 50 MG tablet Take 1 tablet (50 mg total) by mouth daily. 30 tablet 6 09/22/2022     Review of Systems  All systems reviewed and negative except as stated in HPI  Physical Exam Ht '5\' 4"'$  (1.626 m)   Wt 66 kg   LMP 12/22/2021   BMI 24.96 kg/m   Physical Exam Constitutional:      General: She is not in acute distress.    Appearance: Normal appearance. She is not toxic-appearing.  HENT:     Head: Normocephalic and atraumatic.     Nose: Nose normal.     Mouth/Throat:     Mouth: Mucous membranes are moist.  Eyes:     Extraocular Movements: Extraocular movements intact.     Conjunctiva/sclera: Conjunctivae normal.  Cardiovascular:     Rate and Rhythm: Normal rate.     Pulses: Normal pulses.  Pulmonary:     Effort: Pulmonary effort is normal. No respiratory distress.  Abdominal:     Tenderness: There is no abdominal tenderness.     Comments: Gravid  Skin:    General: Skin is warm and dry.     Capillary Refill: Capillary refill takes less than 2 seconds.  Neurological:     General: No focal deficit present.     Mental Status: She is alert.  Psychiatric:        Mood and Affect: Mood normal.        Behavior: Behavior normal.     FHR: 142bpm  Prenatal labs: ABO, Rh:  --/--/O POS (03/03 0715) Antibody: NEG (03/03 0715) Rubella: 3.88 (08/24 1056) RPR: Non Reactive (12/13 0814)  HBsAg: Negative (08/24 1056)  HIV: Non Reactive (12/13 0814)  GC/Chlamydia:  Neisseria Gonorrhea  Date Value Ref Range Status  09/04/2022 Negative  Final   Chlamydia  Date Value Ref  Range Status  09/04/2022 Negative  Final   GBS: Negative/-- (02/14 1100)  Prenatal Transfer Tool  Maternal Diabetes: No Genetic Screening: low risk Maternal Ultrasounds/Referrals: Normal Fetal Ultrasounds or other Referrals:  None Maternal Substance Abuse:  No Significant Maternal Medications:  None Significant Maternal Lab Results: Group B Strep negative  Results for orders placed or performed during the hospital encounter of 09/22/22 (from the past 24 hour(s))  Type and screen Madeline Nelson   Collection Time: 09/22/22  7:15 AM  Result Value Ref Range   ABO/RH(D) O POS    Antibody Screen NEG    Sample Expiration      09/25/2022,2359 Performed at [facility], [place] [address] [place], [place] [address]   CBC   Collection Time: 09/22/22  7:23 AM  Result Value Ref Range   WBC 10.8 (H) 4.0 - 10.5 K/uL   RBC 4.15 3.87 - 5.11 MIL/uL   Hemoglobin 11.8 (L) 12.0 - 15.0 g/dL   HCT 34.6 (L) 36.0 - 46.0 %   MCV 83.4 80.0 - 100.0 fL   MCH 28.4 26.0 - 34.0 pg   MCHC 34.1 30.0 - 36.0 g/dL   RDW 12.9 11.5 - 15.5 %   Platelets 298 150 - 400 K/uL   nRBC 0.0 0.0 - 0.2 %    Assessment: Madeline Nelson is a 37 y.o. [id number] at 1w1dhere for scheduled cesarean section.  #Repeat Low Transverse Cesarean  The risks of cesarean section were discussed with the patient including but were not limited to: bleeding which may require transfusion or reoperation; infection which may require antibiotics; injury to bowel, bladder, ureters or other surrounding organs; injury to the fetus; need for additional procedures including hysterectomy in the event of a life-threatening hemorrhage;  placental abnormalities wth subsequent pregnancies, incisional problems, thromboembolic phenomenon and other postoperative/anesthesia complications.  Patient also desires permanent sterilization.  Other reversible forms of contraception were discussed with patient; she declines all other modalities. Risks of procedure discussed with patient including but not limited to: risk of regret, permanence of method, bleeding, infection, injury to surrounding organs and need for additional procedures.  Failure risk of about 1% with increased risk of ectopic gestation if pregnancy occurs was also discussed with patient.  Also discussed possibility of post-tubal pain syndrome. The patient concurred with the proposed plan, giving informed written consent for the procedures.  Patient has been NPO since last night she will remain NPO for procedure. Anesthesia and OR aware.  Preoperative prophylactic antibiotics and SCDs ordered on call to the OR.   Risks of procedure discussed with patient by Dr EElonda Husky  #Anesthesia: spinal #FWB: FHR 142bpm #GBS/ID: Negative #MOF: breast feeding #MOC: bilateral tubal ligation #Circ: No, female   CLiliane ChannelMD MPH OB Fellow, [place]3/09/2022    09/22/2022, 8:16 AM

## 2022-09-22 NOTE — Discharge Summary (Signed)
Postpartum Discharge Summary    Patient Name: Madeline Nelson DOB: 12/17/1985 MRN: [id number]  Date of admission: 09/22/2022 Delivery date:09/22/2022  Delivering provider: Florian Buff  Date of discharge: 09/24/2022  Admitting diagnosis: Intrauterine normal pregnancy [Z34.90] Status post repeat low transverse cesarean section [Z98.891] Intrauterine pregnancy: [redacted]w[redacted]d     Secondary diagnosis:  Principal Problem:   Status post repeat low transverse cesarean section Active Problems:   Intrauterine normal pregnancy   Status post tubal ligation at time of delivery, current hospitalization  Additional problems: elevated BP    Discharge diagnosis: Term Pregnancy Delivered                                              Post partum procedures:intra-op tubal ligation Augmentation: N/A Complications: None  Hospital course: Sceduled C/S   37 y.o. yo [id number] at [redacted]w[redacted]d was admitted to the hospital 09/22/2022 for scheduled cesarean section with the following indication:Elective Repeat.Delivery details are as follows:  Membrane Rupture Time/Date: 9:21 AM ,09/22/2022   Delivery Method:C-Section, Low Transverse  Details of operation can be found in separate operative note.  Patient had a postpartum course complicated by elevated blood pressures and worsening anemia. She was started on 5d of Lasix and daily oral iron.  She is ambulating, tolerating a regular diet, passing flatus, and urinating well. Patient is discharged home in stable condition on  09/24/22        Newborn Data: Birth date:09/22/2022  Birth time:9:22 AM  Gender:Female  Living status:Living  Apgars:8 ,9  Weight:2960 g     Magnesium Sulfate received: No BMZ received: No Rhophylac:N/A MMR:N/A T-DaP: declined Flu: N/A Transfusion:No  Physical exam  Vitals:   09/23/22 0500 09/23/22 1408 09/23/22 2105 09/24/22 0557  BP: 122/79 129/87 121/80 111/85  Pulse:  75 77 74  Resp: 16  16 16   Temp: 98.1 F (36.7 C)  98.9 F (37.2 C) (!) 97.5 F  (36.4 C)  TempSrc: Oral  Oral Oral  SpO2:   100% 100%  Weight:      Height:       General: alert, cooperative, and no distress Lochia: appropriate Uterine Fundus: firm Incision: No significant erythema, Dressing is clean, dry, and intact DVT Evaluation: No cords or calf tenderness. No significant calf/ankle edema. Labs: Lab Results  Component Value Date   WBC 18.0 (H) 09/23/2022   HGB 9.3 (L) 09/23/2022   HCT 28.6 (L) 09/23/2022   MCV 85.6 09/23/2022   PLT 244 09/23/2022      Latest Ref Rng & Units 09/22/2022    2:42 PM  CMP  Glucose 70 - 99 mg/dL 163   BUN 6 - 20 mg/dL 7   Creatinine 0.44 - 1.00 mg/dL 0.72   Sodium 135 - 145 mmol/L 134   Potassium 3.5 - 5.1 mmol/L 3.8   Chloride 98 - 111 mmol/L 105   CO2 22 - 32 mmol/L 22   Calcium 8.9 - 10.3 mg/dL 8.4   Total Protein 6.5 - 8.1 g/dL 5.3   Total Bilirubin 0.3 - 1.2 mg/dL 0.7   Alkaline Phos 38 - 126 U/L 119   AST 15 - 41 U/L 37   ALT 0 - 44 U/L 13    Edinburgh Score:    09/23/2022   11:45 AM  Edinburgh Postnatal Depression Scale Screening Tool  I have been able to laugh and see the funny  side of things. 0  I have looked forward with enjoyment to things. 0  I have blamed myself unnecessarily when things went wrong. 1  I have been anxious or worried for no good reason. 2  I have felt scared or panicky for no good reason. 2  Things have been getting on top of me. 0  I have been so unhappy that I have had difficulty sleeping. 0  I have felt sad or miserable. 1  I have been so unhappy that I have been crying. 0  The thought of harming myself has occurred to me. 0  Edinburgh Postnatal Depression Scale Total 6     After visit meds:  Allergies as of 09/24/2022       Reactions   Sudafed [pseudoephedrine Hcl] Palpitations        Medication List     TAKE these medications    acetaminophen 160 MG/5ML solution Commonly known as: TYLENOL Take 20.3 mLs (650 mg total) by mouth every 6 (six) hours.   ferrous  sulfate 325 (65 FE) MG tablet Take 1 tablet (325 mg total) by mouth daily with breakfast.   furosemide 20 MG tablet Commonly known as: LASIX Take 1 tablet (20 mg total) by mouth daily.   ibuprofen 600 MG tablet Commonly known as: ADVIL Take 1 tablet (600 mg total) by mouth every 6 (six) hours.   omeprazole 20 MG capsule Commonly known as: PRILOSEC Take 1 capsule (20 mg total) by mouth daily. 1 tablet a day   oxyCODONE 5 MG immediate release tablet Commonly known as: Oxy IR/ROXICODONE Take 1 tablet (5 mg total) by mouth every 4 (four) hours as needed for up to 10 doses for moderate pain.   PRENATAL VITAMIN PO Take 1 tablet by mouth daily.   promethazine 25 MG tablet Commonly known as: PHENERGAN Take 1 tablet (25 mg total) by mouth every 6 (six) hours as needed for nausea or vomiting.   senna-docusate 8.6-50 MG tablet Commonly known as: Senokot-S Take 2 tablets by mouth daily.   sertraline 50 MG tablet Commonly known as: Zoloft Take 1 tablet (50 mg total) by mouth daily.         Discharge home in stable condition Infant Feeding: Breast Infant Disposition:home with mother Discharge instruction: per After Visit Summary and Postpartum booklet. Activity: Advance as tolerated. Pelvic rest for 6 weeks.  Diet: routine diet Future Appointments: Future Appointments  Date Time Provider [place]  09/30/2022 10:10 AM Roma Schanz, CNM [facility]  11/06/2022 10:50 AM Myrtis Ser, CNM [facility]   Follow up Visit:  The following message was sent to [facility] by Mikki Santee, MD  Please schedule this patient for a In person postpartum visit in 6 weeks with the following provider: MD. Additional Postpartum F/U:Incision check 1 week  Low risk pregnancy complicated by:  3 prior C-sections Delivery mode:  C-Section, Low Transverse  Anticipated Birth Control:   BTL done intra-op   Tyton Abdallah Isaias Sakai) Rollene Rotunda, MSN, [place] [facility] [place]  09/24/22  10:33 AM

## 2022-09-22 NOTE — Progress Notes (Addendum)
MOB was referred for history of anxiety and OCD. * Referral screened out by Clinical Social Worker because none of the following criteria appear to apply: ~ History of anxiety/depression during this pregnancy, or of post-partum depression following prior delivery. ~ Diagnosis of anxiety and/or depression within last 3 years OR * MOB's symptoms currently being treated with medication and/or therapy. MOB is prescribed Zoloft 50 mg daily. Please contact the Clinical Social Worker if needs arise, by [facility] request, or if MOB scores greater than 9/yes to question 10 on Edinburgh Postpartum Depression Screen.  Signed,  Berniece Salines, MSW, LCSWA, LCASA 09/22/2022 10:55 AM

## 2022-09-23 ENCOUNTER — Encounter (HOSPITAL_COMMUNITY): Payer: Self-pay | Admitting: Obstetrics & Gynecology

## 2022-09-23 LAB — CBC
HCT: 28.6 % — ABNORMAL LOW (ref 36.0–46.0)
Hemoglobin: 9.3 g/dL — ABNORMAL LOW (ref 12.0–15.0)
MCH: 27.8 pg (ref 26.0–34.0)
MCHC: 32.5 g/dL (ref 30.0–36.0)
MCV: 85.6 fL (ref 80.0–100.0)
Platelets: 244 10*3/uL (ref 150–400)
RBC: 3.34 MIL/uL — ABNORMAL LOW (ref 3.87–5.11)
RDW: 12.9 % (ref 11.5–15.5)
WBC: 18 10*3/uL — ABNORMAL HIGH (ref 4.0–10.5)
nRBC: 0 % (ref 0.0–0.2)

## 2022-09-23 MED ORDER — FERROUS SULFATE 325 (65 FE) MG PO TABS
325.0000 mg | ORAL_TABLET | Freq: Every day | ORAL | Status: DC
  Administered 2022-09-24: 325 mg via ORAL
  Filled 2022-09-23: qty 1

## 2022-09-23 NOTE — Progress Notes (Signed)
POSTPARTUM PROGRESS NOTE  POD #1  Subjective:  Madeline Nelson is a 37 y.o. [id number] s/p rLTCS at 23w1dwith BTL.  She reports she doing well. No acute events overnight.  She denies any problems with ambulating, voiding or po intake. Denies nausea or vomiting. She has passed flatus. Pain is poorly controlled, rates 8/10 particularly with movement.  Lochia is less than menses.  Objective: Blood pressure 122/79, pulse 63, temperature 98.1 F (36.7 C), temperature source Oral, resp. rate 16, height '5\' 4"'$  (1.626 m), weight 66 kg, last menstrual period 12/22/2021, SpO2 98 %, unknown if currently breastfeeding.  Physical Exam:  General: alert, cooperative and no distress Chest: no respiratory distress Heart: regular rate, distal pulses intact Abdomen: soft, nontender,  Uterine Fundus: firm, appropriately tender DVT Evaluation: No calf swelling or tenderness Extremities: no edema Skin: warm, dry; incision with small area of central dry blood/dry/intact w/ honeycomb dressing in place  Recent Labs    09/22/22 0723 09/23/22 0548  HGB 11.8* 9.3*  HCT 34.6* 28.6*    Assessment/Plan: Madeline Grohis a 37y.o. [id number]/p rLTCS w BTL at [redacted]w[redacted]d POD#1 - Doing well; pain poorly controlled. Will utilize prn oxycodone.  Routine postpartum care  OOB, ambulated  Lovenox for VTE prophylaxis Anemia: asymptomatic. Hgb 11.8>9.3  Start po ferrous sulfate qd Elevated BP: Lasix '20mg'$  x5d Contraception: s/p BTL Feeding: breast  Dispo: Plan for discharge tomorrow or Thursday.   LOS: 1 day   Madeline RoundD PGY-1 09/23/2022, 8:19 AM

## 2022-09-24 ENCOUNTER — Other Ambulatory Visit (HOSPITAL_COMMUNITY): Payer: Self-pay

## 2022-09-24 MED ORDER — ACETAMINOPHEN 160 MG/5ML PO SUSP
650.0000 mg | Freq: Four times a day (QID) | ORAL | 0 refills | Status: DC
  Filled 2022-09-24: qty 118, 2d supply, fill #0

## 2022-09-24 MED ORDER — OXYCODONE HCL 5 MG PO TABS
5.0000 mg | ORAL_TABLET | ORAL | 0 refills | Status: DC | PRN
  Filled 2022-09-24: qty 10, 2d supply, fill #0

## 2022-09-24 MED ORDER — FERROUS SULFATE 325 (65 FE) MG PO TABS
325.0000 mg | ORAL_TABLET | Freq: Every day | ORAL | 3 refills | Status: AC
  Filled 2022-09-24: qty 30, 30d supply, fill #0

## 2022-09-24 MED ORDER — IBUPROFEN 600 MG PO TABS
600.0000 mg | ORAL_TABLET | Freq: Four times a day (QID) | ORAL | 0 refills | Status: DC
  Filled 2022-09-24: qty 30, 8d supply, fill #0

## 2022-09-24 MED ORDER — SENNOSIDES-DOCUSATE SODIUM 8.6-50 MG PO TABS
2.0000 | ORAL_TABLET | Freq: Every day | ORAL | 0 refills | Status: DC
  Filled 2022-09-24: qty 30, 15d supply, fill #0

## 2022-09-24 MED ORDER — FUROSEMIDE 20 MG PO TABS
20.0000 mg | ORAL_TABLET | Freq: Every day | ORAL | 0 refills | Status: DC
  Filled 2022-09-24: qty 3, 3d supply, fill #0

## 2022-09-30 ENCOUNTER — Encounter: Payer: Self-pay | Admitting: Women's Health

## 2022-09-30 ENCOUNTER — Ambulatory Visit (INDEPENDENT_AMBULATORY_CARE_PROVIDER_SITE_OTHER): Payer: BC Managed Care – PPO | Admitting: Women's Health

## 2022-09-30 VITALS — BP 123/83 | HR 78 | Wt 139.4 lb

## 2022-09-30 DIAGNOSIS — Z4889 Encounter for other specified surgical aftercare: Secondary | ICD-10-CM

## 2022-09-30 DIAGNOSIS — F419 Anxiety disorder, unspecified: Secondary | ICD-10-CM

## 2022-09-30 MED ORDER — SERTRALINE HCL 25 MG PO TABS: 25.0000 mg | ORAL_TABLET | Freq: Every day | ORAL | 6 refills | Status: AC

## 2022-09-30 NOTE — Progress Notes (Signed)
   GYN VISIT Patient name: Madeline Nelson MRN [id number]  Date of birth: [birth year] Chief Complaint:   Routine Post Op  History of Present Illness:   Madeline Nelson is a 36 y.o. [id number] Caucasian female 8d s/p RCS w/ BTL  being seen today for incision check. Breastfeeding going great. On zoloft 50mg  for anxiety, has highs and lows, wouldn't mind trying increase of zoloft. Declines IBH/therapy. Denies PPD, states she had w/ her son and definitely does not have any right now. Denies SI/HI/II. Requests refill on oxycodone- discussed recommendation of ibuprofen 600mg  q6hr w/ food, warm showers, heating pad, etc.      No LMP recorded.    03/14/2022    9:38 AM 01/08/2021   10:47 AM  Depression screen PHQ 2/9  Decreased Interest 1 0  Down, Depressed, Hopeless 1 0  PHQ - 2 Score 2 0  Altered sleeping 0 0  Tired, decreased energy 1 1  Change in appetite 0 0  Feeling bad or failure about yourself  0 0  Trouble concentrating 0 0  Moving slowly or fidgety/restless 0 0  Suicidal thoughts 0 0  PHQ-9 Score 3 1        03/14/2022    9:38 AM 01/08/2021   10:47 AM  GAD 7 : Generalized Anxiety Score  Nervous, Anxious, on Edge 1 1  Control/stop worrying 0 0  Worry too much - different things 0 0  Trouble relaxing 0 0  Restless 0 0  Easily annoyed or irritable 1 0  Afraid - awful might happen 0 0  Total GAD 7 Score 2 1     Review of Systems:   Pertinent items are noted in HPI Denies fever/chills, dizziness, headaches, visual disturbances, fatigue, shortness of breath, chest pain, abdominal pain, vomiting, abnormal vaginal discharge/itching/odor/irritation, problems with periods, bowel movements, urination, or intercourse unless otherwise stated above.  Pertinent History Reviewed:  Reviewed past medical,surgical, social, obstetrical and family history.  Reviewed problem list, medications and allergies. Physical Assessment:   Vitals:   09/30/22 1031  BP: 123/83  Pulse: 78  Weight: 139 lb  6.4 oz (63.2 kg)  Body mass index is 23.93 kg/m.       Physical Examination:   General appearance: alert, well appearing, and in no distress  Mental status: alert, oriented to person, place, and time  Skin: warm & dry   Cardiovascular: normal heart rate noted  Respiratory: normal respiratory effort, no distress  Abdomen: soft, non-tender, incision healing well, no s/s infection  Pelvic: examination not indicated  Extremities: no edema   Chaperone: N/A    No results found for this or any previous visit (from the past 24 hour(s)).  Assessment & Plan:  1) 8d s/p RCS w/ BTL> healing well, breastfeeding  2) Anxiety> increase zoloft to 75mg , denies PPD, declined IBH/therapy right now  Meds:  Meds ordered this encounter  Medications   sertraline (ZOLOFT) 25 MG tablet    Sig: Take 1 tablet (25 mg total) by mouth daily. With the 50mg  tablet to equal 75mg     Dispense:  30 tablet    Refill:  6    No orders of the defined types were placed in this encounter.   Return for As scheduled 4/17 for pp visit.  Elizabeth, [facility] 09/30/2022 10:46 AM

## 2022-10-30 ENCOUNTER — Other Ambulatory Visit: Payer: Self-pay | Admitting: Obstetrics & Gynecology

## 2022-11-06 ENCOUNTER — Encounter: Payer: Self-pay | Admitting: Advanced Practice Midwife

## 2022-11-06 ENCOUNTER — Ambulatory Visit (INDEPENDENT_AMBULATORY_CARE_PROVIDER_SITE_OTHER): Payer: BC Managed Care – PPO | Admitting: Advanced Practice Midwife

## 2022-11-06 DIAGNOSIS — Z98891 History of uterine scar from previous surgery: Secondary | ICD-10-CM

## 2022-11-06 DIAGNOSIS — F419 Anxiety disorder, unspecified: Secondary | ICD-10-CM | POA: Diagnosis not present

## 2022-11-06 NOTE — Progress Notes (Signed)
POSTPARTUM VISIT Patient name: Madeline Nelson MRN [id number]  Date of birth: February 10, 1986 Chief Complaint:   Postpartum Care  History of Present Illness:   Madeline Nelson is a 37 y.o. [id number] Caucasian female being seen today for a postpartum visit. She is 6 weeks postpartum following a repeat cesarean section, low transverse incision at 39.1 gestational weeks. IOL: n/a. Anesthesia: spinal.  Laceration: n/a.  Complications: inpatient labile BP, no dx. Inpatient contraception: yes BTL (Filshie Clips d/t adhesions/engorged vessels in mesosalpinx) .   Pregnancy complicated by prev C/S x 3 . Tobacco use: former . Substance use disorder: no. Last pap smear: August 2023 and results were NILM w/ HRHPV negative. Next pap smear due: August 2026 No LMP recorded. (Menstrual status: Lactating).  Postpartum course has been uncomplicated. Bleeding none. Bowel function is normal. Bladder function is normal. Urinary incontinence? yes very intermittently; recommended Kegals and pelvic weights; if no improvement in 2 mos, we can refer to PT , fecal incontinence? no Patient is sexually active. Last sexual activity:  (didn't ask) . Desired contraception: BTL done PP. Patient does not want a pregnancy in the future.  Desired family size is 4 children.   Upstream - 11/06/22 1056       Pregnancy Intention Screening   Does the patient want to become pregnant in the next year? No    Does the patient's partner want to become pregnant in the next year? No    Would the patient like to discuss contraceptive options today? No      Contraception Wrap Up   Current Method Female Sterilization    End Method Female Sterilization    Contraception Counseling Provided No            The pregnancy intention screening data noted above was reviewed. Potential methods of contraception were discussed. The patient elected to proceed with Female Sterilization.  Edinburgh Postpartum Depression Screening: positive: H/O mental  health disorder: yes anxiety w panic attacks. Currently on meds: yes Zoloft  (didn't want to increase to ).  Currently in therapy: no.  Sleeping: as expected.  Appetite: nl.  Still finds joy in things she used to: Yes.  Support at home: yes.  SI/HI/II: no.  Interested in medicine: Yes.  Interested in therapy: No.  Edinburgh Postnatal Depression Scale - 11/06/22 1055       Edinburgh Postnatal Depression Scale:  In the Past 7 Days   I have been able to laugh and see the funny side of things. 1    I have looked forward with enjoyment to things. 1    I have blamed myself unnecessarily when things went wrong. 2    I have been anxious or worried for no good reason. 2    I have felt scared or panicky for no good reason. 1    Things have been getting on top of me. 1    I have been so unhappy that I have had difficulty sleeping. 0    I have felt sad or miserable. 1    I have been so unhappy that I have been crying. 1    The thought of harming myself has occurred to me. 0    Edinburgh Postnatal Depression Scale Total 10                03/14/2022    9:38 AM 01/08/2021   10:47 AM  GAD 7 : Generalized Anxiety Score  Nervous, Anxious, on Edge 1 1  Control/stop worrying 0 0  Worry too much - different things 0 0  Trouble relaxing 0 0  Restless 0 0  Easily annoyed or irritable 1 0  Afraid - awful might happen 0 0  Total GAD 7 Score 2 1     Baby's course has been uncomplicated (had some jaundice concern but is doing better now). Baby is feeding by breast: milk supply adequate. Infant has a pediatrician/family doctor? Yes.  Childcare strategy if returning to work/school: n/a-stay at home mom.  Pt has material needs met for her and baby: Yes.   Review of Systems:   Pertinent items are noted in HPI Denies Abnormal vaginal discharge w/ itching/odor/irritation, headaches, visual changes, shortness of breath, chest pain, abdominal pain, severe nausea/vomiting, or problems with urination or  bowel movements. Pertinent History Reviewed:  Reviewed past medical,surgical, obstetrical and family history.  Reviewed problem list, medications and allergies. OB History  Gravida Para Term Preterm AB Living  5 4 4   1 4   SAB IAB Ectopic Multiple Live Births  1     0 4    # Outcome Date GA Lbr Len/2nd Weight Sex Delivery Anes PTL Lv  5 Term 09/22/22 [redacted]w[redacted]d  6 lb 8.4 oz (2.96 kg) F CS-LTranv Spinal  LIV  4 SAB 2022          3 Term 10/14/17 [redacted]w[redacted]d  7 lb 2 oz (3.232 kg) M CS-LTranv Spinal N LIV  2 Term 01/12/16 [redacted]w[redacted]d  5 lb 13 oz (2.637 kg) F CS-LTranv Spinal N LIV  1 Term 03/19/12 [redacted]w[redacted]d  6 lb 7 oz (2.92 kg) F CS-LTranv EPI, Gen N LIV     Complications: Fetal Intolerance    Obstetric Comments  Scant amount of vaginal bleeding noted this date, hx of same with prior pregnancy   Physical Assessment:   Vitals:   11/06/22 1049 11/06/22 1115  BP: (!) 131/90 (!) 135/92  Pulse: 69 64  Weight: 144 lb (65.3 kg)   Height: 5\' 4"  (1.626 m)   Body mass index is 24.72 kg/m.       Physical Examination:   General appearance: alert, well appearing, and in no distress  Mental status: alert, oriented to person, place, and time  Skin: warm & dry   Cardiovascular: normal heart rate noted   Respiratory: normal respiratory effort, no distress   Breasts: deferred, no complaints   Abdomen: soft, non-tender; LTCS incision well-healed  Pelvic: examination not indicated. Thin prep pap obtained: No  Rectal: not examined  Extremities: Edema: none        No results found for this or any previous visit (from the past 24 hour(s)).  Assessment & Plan:  1) Postpartum exam 2) Six wks s/p repeat cesarean section, low transverse incision with BTL (Filshie Clips) 3) breast feeding 4) Depression screening: positive; on Zoloft 50mg  and is stable; wanted to try improving diet/exercise before increasing to 75mg  5) Contraception management: s/p BTL 6) Mild ^BP today: was nl at 1wk PP check; has 4 kids with her and  is stressed; will check at home daily over the next few days and if getting normal values will continue routine care; will call if getting values >140/90  Essential components of care per ACOG recommendations:  1.  Mood and well being:  If positive depression screen, discussed and plan developed.  If using tobacco we discussed reduction/cessation and risk of relapse If current substance abuse, we discussed and referral to local resources was offered.   2. Infant care and feeding:  If breastfeeding, discussed returning to work, pumping, breastfeeding-associated pain, guidance regarding return to fertility while lactating if not using another method. If needed, patient was provided with a letter to be allowed to pump q 2-3hrs to support lactation in a private location with access to a refrigerator to store breastmilk.   Recommended that all caregivers be immunized for flu, pertussis and other preventable communicable diseases If pt does not have material needs met for her/baby, referred to local resources for help obtaining these.  3. Sexuality, contraception and birth spacing Provided guidance regarding sexuality, management of dyspareunia, and resumption of intercourse Discussed avoiding interpregnancy interval <92mths and recommended birth spacing of 18 months  4. Sleep and fatigue Discussed coping options for fatigue and sleep disruption Encouraged family/partner/community support of 4 hrs of uninterrupted sleep to help with mood and fatigue  5. Physical recovery  If pt had a C/S, assessed incisional pain and providing guidance on normal vs prolonged recovery If pt had a laceration, perineal healing and pain reviewed.  If urinary or fecal incontinence, discussed management and referred to PT or uro/gyn if indicated  Patient is safe to resume physical activity. Discussed attainment of healthy weight.  6.  Chronic disease management Discussed pregnancy complications if any, and their  implications for future childbearing and long-term maternal health. Review recommendations for prevention of recurrent pregnancy complications, such as 17 hydroxyprogesterone caproate to reduce risk for recurrent PTB not applicable, or aspirin to reduce risk of preeclampsia not applicable. Pt had GDM: no. If yes, 2hr GTT scheduled: not applicable. Reviewed medications and non-pregnant dosing including consideration of whether pt is breastfeeding using a reliable resource such as LactMed: yes Referred for f/u w/ PCP or subspecialist providers as indicated: Goes to [employer]  7. Health maintenance Mammogram at 37yo or earlier if indicated Pap smears as indicated  Meds: No orders of the defined types were placed in this encounter.   Follow-up: Return in about 1 year (around 11/06/2023) for Physical.   No orders of the defined types were placed in this encounter.   Arabella Merles CNM 11/06/2022 11:31 AM

## 2022-11-06 NOTE — Patient Instructions (Addendum)
Use the website www.postpartum.net for postpartum and postpartum depression resources; also use the Lact Med website for medications that are safe with breastfeeding.  [employer] for lubrication [employer]- [employer] pelvic [employer]

## 2023-01-10 IMAGING — US US OB COMP LESS 14 WK
1 series · 14 of 28 positions shown · non-contrast
Comparison: No recent prior available.

CLINICAL DATA: Pregnancy.  Bleeding.

EXAM:
OBSTETRIC <14 WK US AND TRANSVAGINAL OB US
TECHNIQUE: Both transabdominal and transvaginal ultrasound examinations were
performed for complete evaluation of the gestation as well as the
maternal uterus, adnexal regions, and pelvic cul-de-sac.
Transvaginal technique was performed to assess early pregnancy.

[Series 1: us ob comp less 14 wks · 14 of 100 slices shown]
[im 4/100]
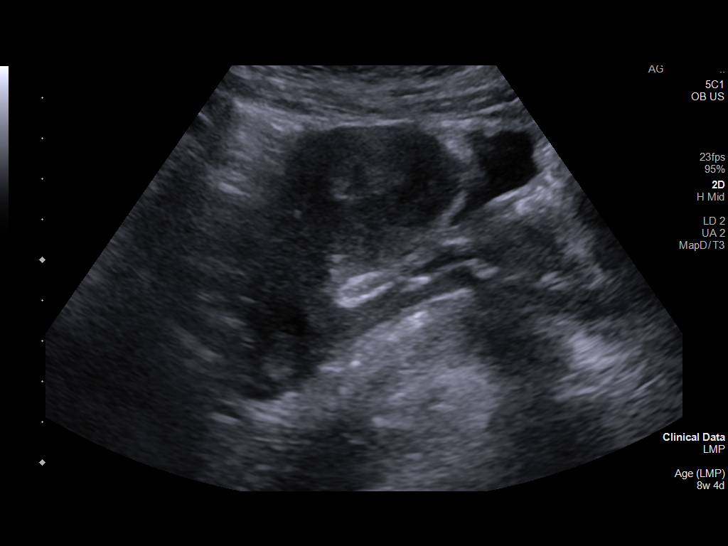
[im 12/100]
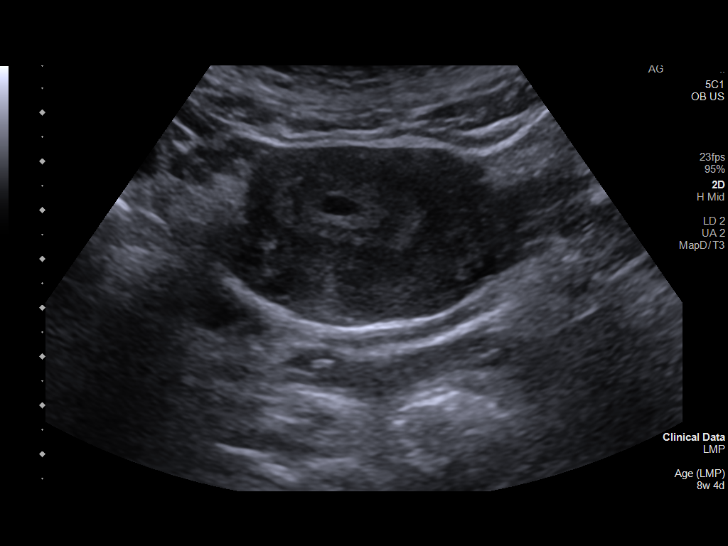
[im 19/100]
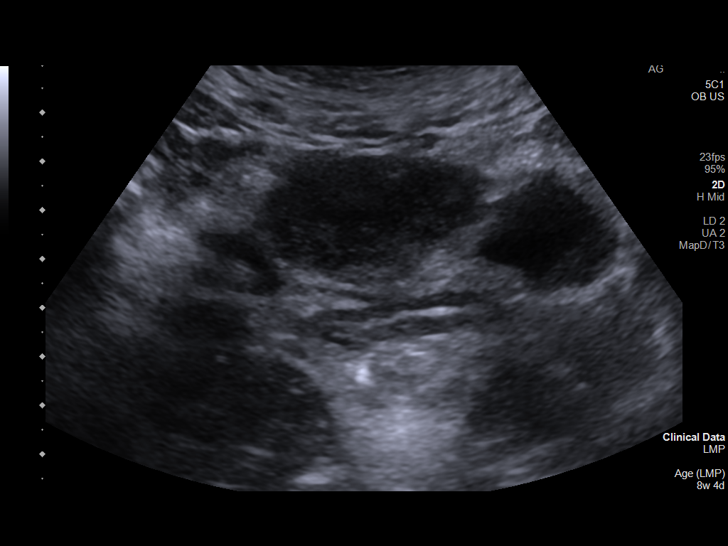
[im 26/100]
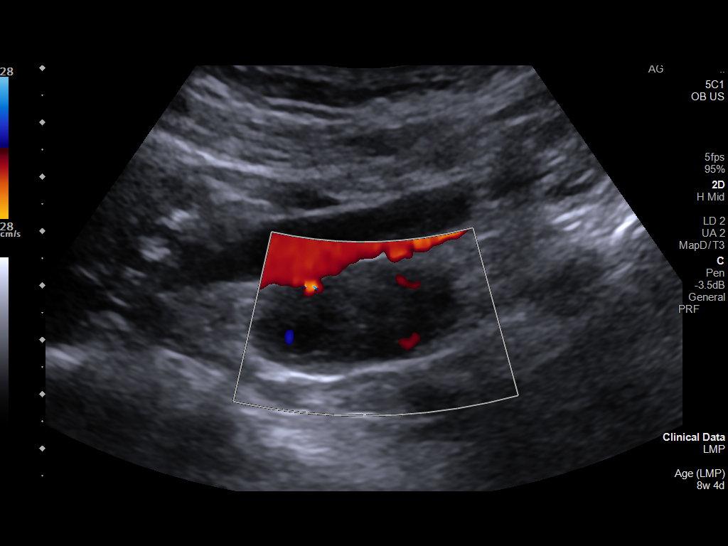
[im 34/100]
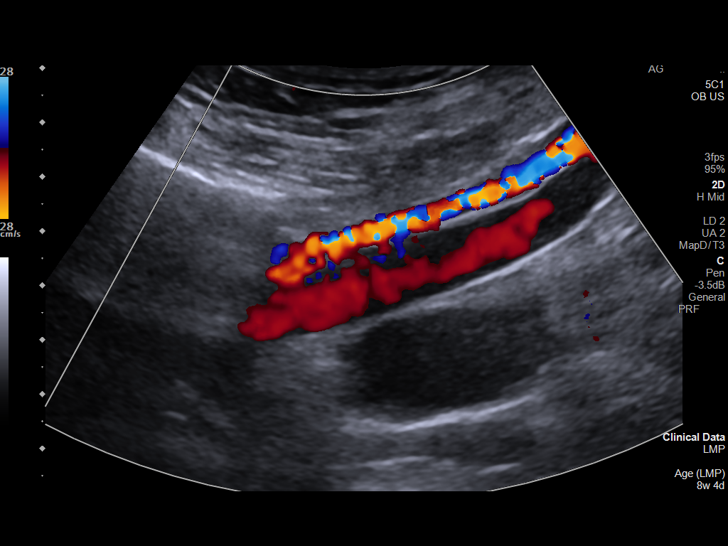
[im 41/100]
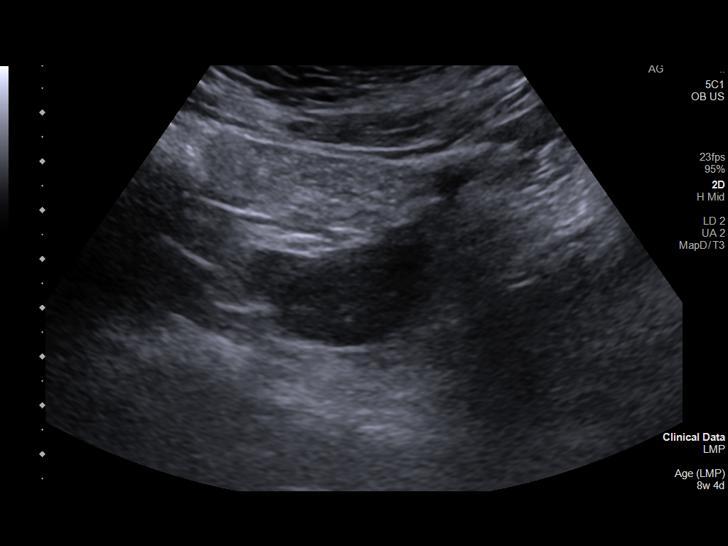
[im 48/100]
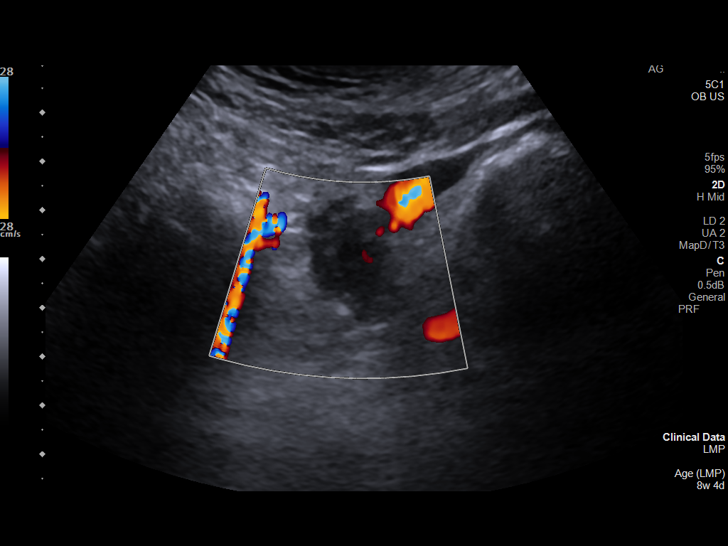
[im 56/100]
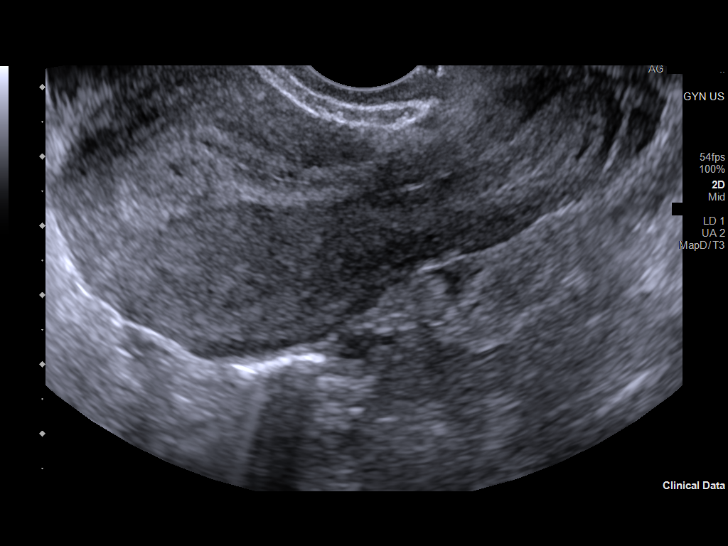
[im 63/100]
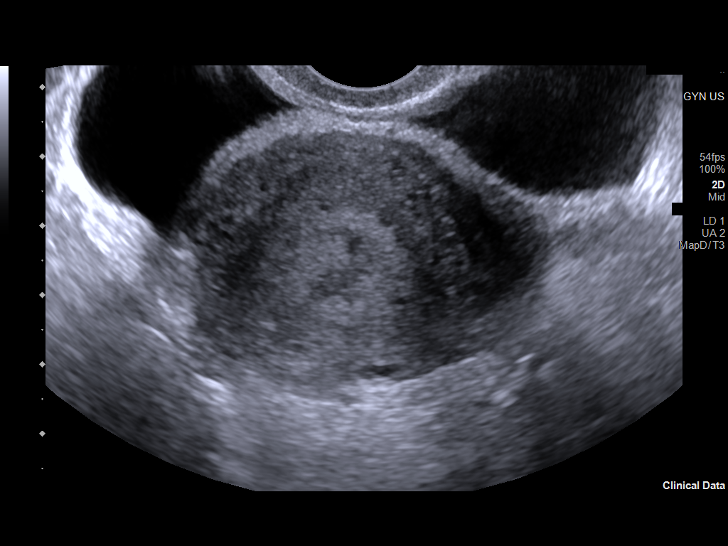
[im 70/100]
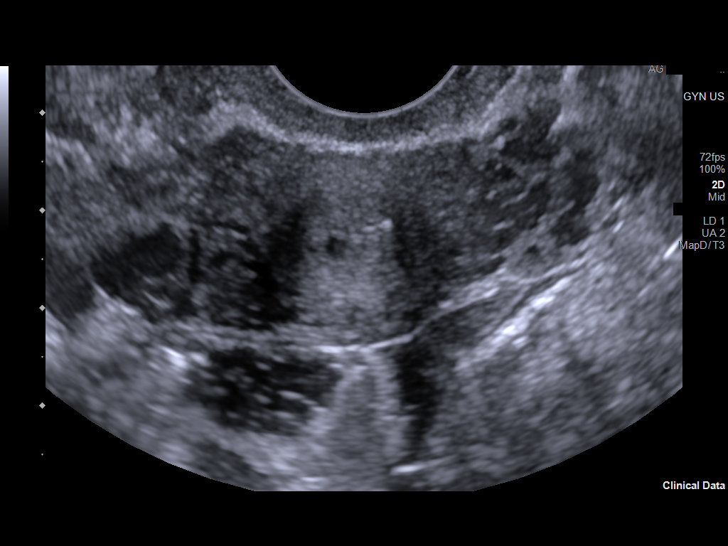
[im 78/100]
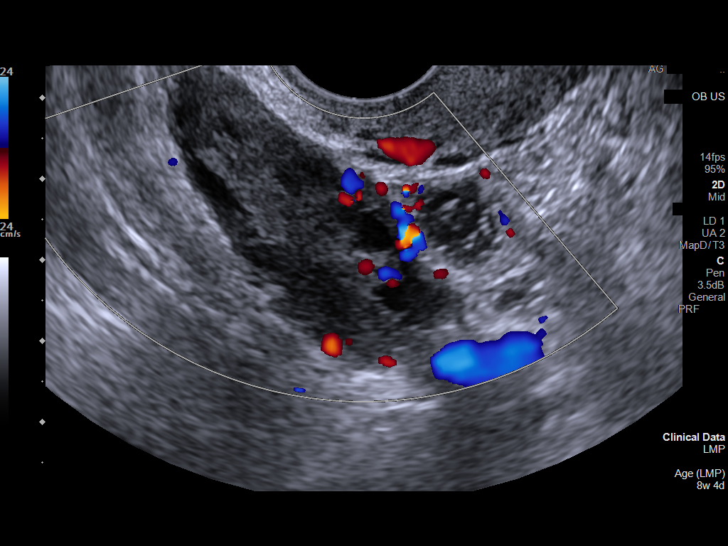
[im 85/100]
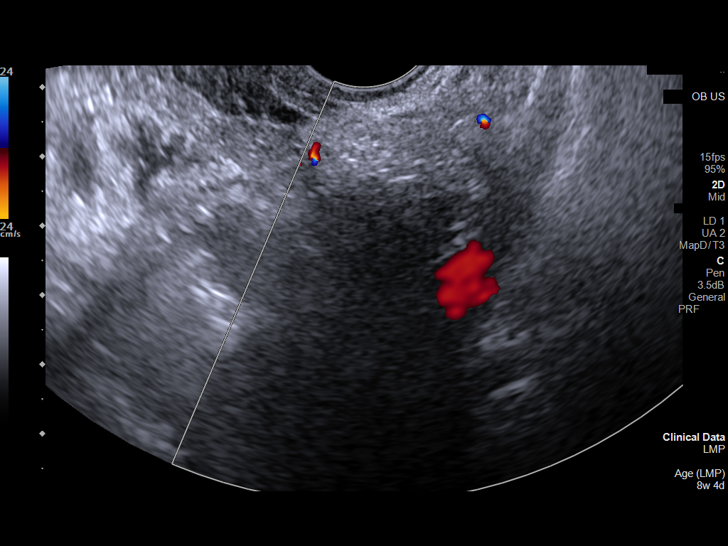
[im 92/100]
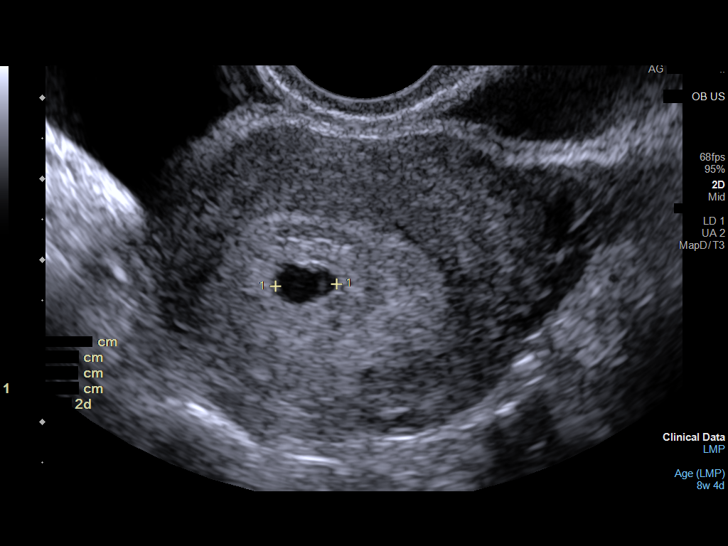
[im 100/100]
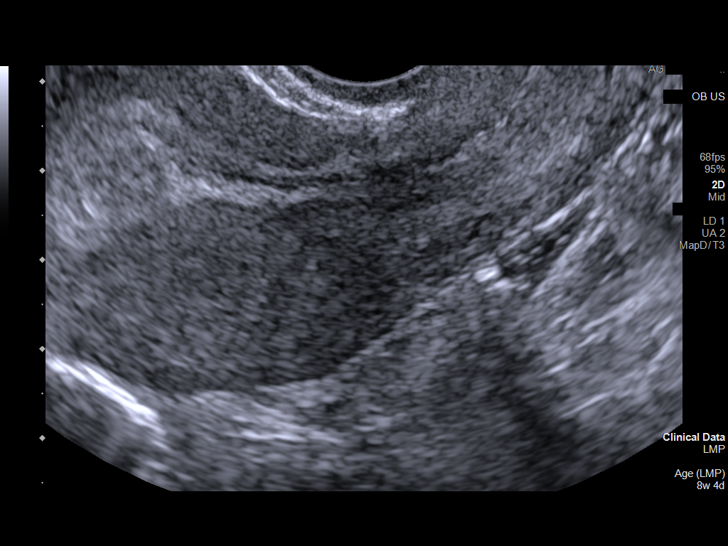

[14 of 28 positions shown; findings below may reference images not displayed]

FINDINGS: Intrauterine gestational sac: Single

Yolk sac:  Not visualized

Embryo:  Not visualized

Cardiac Activity: Not visualized

MSD: 5.6 mm   5 w   2 d

Subchorionic hemorrhage:  None visualized.

Maternal uterus/adnexae: Unremarkable.  No free pelvic fluid.
IMPRESSION: Single tiny intrauterine gestational sac corresponding to 5 weeks 2
day pregnancy. Probable early intrauterine gestational sac, but no
yolk sac, fetal pole, or cardiac activity yet visualized. Recommend
follow-up quantitative B-HCG levels and follow-up US in 14 days to
assess viability. This recommendation follows SRU consensus
guidelines: Diagnostic Criteria for Nonviable Pregnancy Early in the
First Trimester. N Engl J Med 6979; [DATE].

## 2024-03-09 ENCOUNTER — Other Ambulatory Visit: Payer: Self-pay

## 2024-03-09 ENCOUNTER — Encounter: Payer: Self-pay | Admitting: Emergency Medicine

## 2024-03-09 ENCOUNTER — Ambulatory Visit
Admission: EM | Admit: 2024-03-09 | Discharge: 2024-03-09 | Disposition: A | Discharge status: Home / Self Care | Attending: Nurse Practitioner | Admitting: Nurse Practitioner

## 2024-03-09 DIAGNOSIS — M5441 Lumbago with sciatica, right side: Secondary | ICD-10-CM

## 2024-03-09 MED ORDER — PREDNISONE 20 MG PO TABS: 40.0000 mg | ORAL_TABLET | Freq: Every day | ORAL | 0 refills | Status: AC

## 2024-03-09 MED ORDER — TIZANIDINE HCL 4 MG PO TABS: 4.0000 mg | ORAL_TABLET | Freq: Three times a day (TID) | ORAL | 0 refills | Status: AC | PRN

## 2024-03-09 MED ORDER — KETOROLAC TROMETHAMINE 60 MG/2ML IM SOLN
60.0000 mg | Freq: Once | INTRAMUSCULAR | Status: AC
  Administered 2024-03-09: 60 mg via INTRAMUSCULAR

## 2024-03-09 MED ORDER — DEXAMETHASONE SODIUM PHOSPHATE 10 MG/ML IJ SOLN
10.0000 mg | INTRAMUSCULAR | Status: AC
  Administered 2024-03-09: 10 mg via INTRAMUSCULAR

## 2024-03-09 NOTE — ED Triage Notes (Signed)
 Pt reports lower right back pain that radiates to right lower extremity since twisting back  yesterday. Denies any gi/gu symptoms.

## 2024-03-09 NOTE — ED Provider Notes (Signed)
 [facility]    CSN: [id number] Arrival date & time: 03/09/24  1043      History   Chief Complaint Chief Complaint  Patient presents with   Back Pain    HPI Madeline Nelson is a 38 y.o. female.   The history is provided by the patient.   Patient presents for complaints of right sided low back pain that started 1 day ago.  Patient states she was rushing to the restroom and somehow twisted her back prior to symptoms starting.  She states the pain radiates down to the right thigh.  She reports pain worsens with certain movement.  Patient denies fever, chills, numbness, tingling, loss of bowel or bladder function, urinary frequency, urgency, hesitancy, saddle anesthesia, lower extremity weakness, or the ability to ambulate.  States that she has been taking Tylenol  for her symptoms with minimal relief.  Denies prior history of low back pain.  Past Medical History:  Diagnosis Date   Anxiety    OCD (obsessive compulsive disorder)    Wolff-Parkinson-White (WPW) syndrome     Patient Active Problem List   Diagnosis Date Noted   Status post repeat low transverse cesarean section 01/08/2021   Anxiety w/ panic attacks 01/08/2021   Palpitations 12/22/2013   WPW (Wolff-Parkinson-White syndrome) 12/22/2013   OCD (obsessive compulsive disorder) 12/22/2013   Scoliosis 12/22/2013    Past Surgical History:  Procedure Laterality Date   ATRIAL FIBRILLATION ABLATION     CESAREAN SECTION     CESAREAN SECTION WITH BILATERAL TUBAL LIGATION N/A 09/22/2022   Procedure: REPEAT CESAREAN SECTION WITH BILATERAL TUBAL LIGATION;  Surgeon: Jayne Vonn DEL, MD;  Location: [facility];  Service: Obstetrics;  Laterality: N/A;   toe nail removal Bilateral    WISDOM TOOTH EXTRACTION      OB History     Gravida  5   Para  4   Term  4   Preterm      AB  1   Living  4      SAB  1   IAB      Ectopic      Multiple  0   Live Births  4        Obstetric Comments  Scant amount of  vaginal bleeding noted this date, hx of same with prior pregnancy          Home Medications    Prior to Admission medications   Medication Sig Start Date End Date Taking? Authorizing Provider  ferrous sulfate  325 (65 FE) MG tablet Take 1 tablet (325 mg total) by mouth daily with breakfast. 09/24/22   Emilio Delilah HERO, CNM  Prenatal Vit-Fe Fumarate-FA (PRENATAL VITAMIN PO) Take 1 tablet by mouth daily.    [provider]  sertraline  (ZOLOFT ) 25 MG tablet Take 1 tablet (25 mg total) by mouth daily. With the 50mg  tablet to equal 75mg  09/30/22   Kizzie Suzen SAUNDERS, CNM    Family History Family History  Problem Relation Age of Onset   Alzheimer's disease Mother    Congestive Heart Failure Father    Kidney failure Father    Other Daughter        microduplication 22Q11   Autism Daughter    Other Daughter        microduplication 22Q11    Social History Social History   Tobacco Use   Smoking status: Former    Types: Cigarettes   Smokeless tobacco: Never  Vaping Use   Vaping status: Former  Substance Use Topics   Alcohol use: Yes    Comment: occ   Drug use: No     Allergies   Sudafed [pseudoephedrine hcl]   Review of Systems Review of Systems Per HPI  Physical Exam Triage Vital Signs ED Triage Vitals  Encounter Vitals Group     BP 03/09/24 1200 (!) 137/100     Girls Systolic BP Percentile --      Girls Diastolic BP Percentile --      Boys Systolic BP Percentile --      Boys Diastolic BP Percentile --      Pulse Rate 03/09/24 1200 96     Resp 03/09/24 1200 20     Temp 03/09/24 1200 98.6 F (37 C)     Temp Source 03/09/24 1200 Oral     SpO2 03/09/24 1200 98 %     Weight --      Height --      Head Circumference --      Peak Flow --      Pain Score 03/09/24 1156 8     Pain Loc --      Pain Education --      Exclude from Growth Chart --    No data found.  Updated Vital Signs BP (!) 137/100 (BP Location: Right Arm) Comment: x2 attempts. pt  reports white coat syndrome and anxiety at this time. pt reports pain 8/10. NAD noted.  Pulse 96   Temp 98.6 F (37 C) (Oral)   Resp 20   LMP 02/13/2024 (Approximate)   SpO2 98%   Breastfeeding No   Visual Acuity Right Eye Distance:   Left Eye Distance:   Bilateral Distance:    Right Eye Near:   Left Eye Near:    Bilateral Near:     Physical Exam Vitals and nursing note reviewed.  Constitutional:      General: She is not in acute distress.    Appearance: Normal appearance.  HENT:     Head: Normocephalic.  Eyes:     Extraocular Movements: Extraocular movements intact.     Conjunctiva/sclera: Conjunctivae normal.     Pupils: Pupils are equal, round, and reactive to light.  Cardiovascular:     Rate and Rhythm: Normal rate and regular rhythm.     Pulses: Normal pulses.     Heart sounds: Normal heart sounds.  Pulmonary:     Effort: Pulmonary effort is normal. No respiratory distress.     Breath sounds: Normal breath sounds. No stridor. No wheezing, rhonchi or rales.  Abdominal:     General: Bowel sounds are normal.     Palpations: Abdomen is soft.     Tenderness: There is no abdominal tenderness.  Musculoskeletal:     Cervical back: Normal range of motion.     Lumbar back: Spasms and tenderness present. Decreased range of motion (d/t pain). Positive right straight leg raise test. Scoliosis (history) present.       Back:  Skin:    General: Skin is warm and dry.  Neurological:     General: No focal deficit present.     Mental Status: She is alert and oriented to person, place, and time.  Psychiatric:        Mood and Affect: Mood normal.        Behavior: Behavior normal.      UC Treatments / Results  Labs (all labs ordered are listed, but only abnormal results are displayed) Labs Reviewed - No data to  display  EKG   Radiology No results found.  Procedures Procedures (including critical care time)  Medications Ordered in UC Medications - No data to  display  Initial Impression / Assessment and Plan / UC Course  I have reviewed the triage vital signs and the nursing notes.  Pertinent labs & imaging results that were available during my care of the patient were reviewed by me and considered in my medical decision making (see chart for details).  Toradol  60 mg IM and Decadron  10 mg IM administered for back pain.  Will defer imaging as patient has not experienced trauma..  Will start patient on prednisone  40 mg for the next 5 days along with tizanidine  4 mg.  Supportive care recommendations were provided and discussed with the patient to include continuing over-the-counter analgesics (advised patient to take Tylenol  while she was taking the prednisone ), use of ice or heat, and stretching exercises which were provided.  Discussed indications with patient regarding ER follow-up.  Patient was in agreement with this plan of care and verbalizes understanding.  All questions were answered.  Patient stable for discharge.  Final Clinical Impressions(s) / UC Diagnoses   Final diagnoses:  None   Discharge Instructions   None    ED Prescriptions   None    PDMP not reviewed this encounter.   Gilmer Etta PARAS, NP 03/09/24 1242

## 2024-03-09 NOTE — Discharge Instructions (Addendum)
 You were given an injection of Toradol  60 mg and Decadron  10 mg today.  Do not take any additional NSAID such as Aleve, ibuprofen , Motrin , naproxen, or Advil .  You may take Tylenol  for breakthrough pain or discomfort. Take medication as prescribed.  Please be advised the muscle relaxer may cause drowsiness.  No driving, operating heavy equipment, or drinking alcohol while taking the medication. Try to remain as active as possible. Gentle range of motion and stretching exercises to help with back spasm and pain. May apply ice or heat as needed.  Ice is recommended for pain or swelling, heat for spasm or stiffness.  Apply for 20 minutes, remove for 1 hour, then repeat. Go to the emergency department immediately if you develop weakness in your legs or feet, inability to walk, loss of bowel or bladder function, difficulty urinating or passing a bowel movement, or other concerns. Follow-up with your primary care physician if your symptoms fail to improve.   Follow-up as needed.
